# Patient Record
Sex: Female | Born: 1995 | Race: White | Hispanic: No | Marital: Married | Smoking: Never smoker
Health system: Southern US, Community
[De-identification: ages and names within clinical notes are randomized; demographics above are authoritative.]

## PROBLEM LIST (undated history)

## (undated) DIAGNOSIS — B019 Varicella without complication: Secondary | ICD-10-CM

## (undated) HISTORY — DX: Varicella without complication: B01.9

---
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
## 2015-02-09 ENCOUNTER — Ambulatory Visit: Payer: Self-pay | Admitting: Primary Care

## 2015-02-09 ENCOUNTER — Telehealth: Payer: Self-pay | Admitting: Primary Care

## 2015-02-09 NOTE — Telephone Encounter (Signed)
Patient did not come in for their appointment today for new pt.  Please let me know if patient needs to be contacted immediately for follow up or no follow up needed. °

## 2015-02-09 NOTE — Telephone Encounter (Signed)
Yes, please reschedule. This is not an urgent matter. Thanks.

## 2015-02-10 NOTE — Telephone Encounter (Signed)
Called and notified patient of Lorraine Wallace's comments. Patient verbalized understanding.  

## 2015-02-10 NOTE — Telephone Encounter (Signed)
R/s for august.   Also, pt is asking that we waive the no show fee. He thought the appt was June 10, not June 1.  Please call pt and let her know the decision for the no show fee. She sends her apologies.

## 2015-02-10 NOTE — Telephone Encounter (Signed)
No show fee, please. Thanks

## 2015-04-29 ENCOUNTER — Ambulatory Visit (INDEPENDENT_AMBULATORY_CARE_PROVIDER_SITE_OTHER): Payer: BLUE CROSS/BLUE SHIELD | Admitting: Primary Care

## 2015-04-29 ENCOUNTER — Encounter: Payer: Self-pay | Admitting: Primary Care

## 2015-04-29 VITALS — BP 116/70 | HR 106 | Temp 98.1°F | Ht 67.25 in | Wt 118.0 lb

## 2015-04-29 DIAGNOSIS — Z7689 Persons encountering health services in other specified circumstances: Secondary | ICD-10-CM

## 2015-04-29 DIAGNOSIS — Z7189 Other specified counseling: Secondary | ICD-10-CM | POA: Diagnosis not present

## 2015-04-29 NOTE — Progress Notes (Signed)
Pre visit review using our clinic review tool, if applicable. No additional management support is needed unless otherwise documented below in the visit note. 

## 2015-04-29 NOTE — Patient Instructions (Signed)
Please schedule a physical with me in the next 3 months. You will also schedule a lab only appointment one week prior. We will discuss your lab results during your physical.  It was a pleasure to meet you today! Please don't hesitate to call me with any questions. Welcome to [facility]!   

## 2015-04-29 NOTE — Progress Notes (Signed)
   Subjective:    Patient ID: Lorraine Wallace, female    DOB: 1996-03-27, 19 y.o.   MRN: [id number]  HPI  Lorraine Wallace is a 19 year old female who presents today to establish care.  She has no complaints today.  1) Oral contraceptives: Follows with GYN. Maintained on norethindrone-ethinyl estradiol 1-20 mg-mcg. Regular periods. No pain. Pap completed in June 2016.  Review of Systems  Constitutional: Negative for unexpected weight change.  HENT: Negative for rhinorrhea.   Respiratory: Negative for cough and shortness of breath.   Cardiovascular: Negative for chest pain.  Gastrointestinal: Negative for diarrhea and constipation.  Genitourinary: Negative for difficulty urinating.       Periods regular. Follows with GYN.  Musculoskeletal: Negative for myalgias and arthralgias.  Skin: Negative for rash.  Neurological: Negative for dizziness, numbness and headaches.  Psychiatric/Behavioral:       Denies concerns for anxiety or depression       Past Medical History  Diagnosis Date  . Chicken pox     Social History   Social History  . Marital Status: Single    Spouse Name: N/A  . Number of Children: N/A  . Years of Education: N/A   Occupational History  . Not on file.   Social History Main Topics  . Smoking status: Never Smoker   . Smokeless tobacco: Not on file  . Alcohol Use: 0.0 oz/week    0 Standard drinks or equivalent per week     Comment: social  . Drug Use: Not on file  . Sexual Activity: Not on file   Other Topics Concern  . Not on file   Social History Narrative   Single.   Highest level of education Associates.   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   Enjoys Diplomatic Services operational officer, shopping, going out to [employer].     History reviewed. No pertinent past surgical history.  Family History  Problem Relation Age of Onset  . Lung cancer Maternal Grandmother   . Stroke Maternal Grandmother   . Asthma Mother     No Known Allergies  No current outpatient  prescriptions on file prior to visit.   No current facility-administered medications on file prior to visit.    BP 116/70 mmHg  Pulse 106  Temp(Src) 98.1 F (36.7 C) (Oral)  Ht 5' 7.25" (1.708 m)  Wt 118 lb (53.524 kg)  BMI 18.35 kg/m2  SpO2 98%  LMP 04/27/2015    Objective:   Physical Exam  Constitutional: She is oriented to person, place, and time. She appears well-nourished.  Cardiovascular: Normal rate and regular rhythm.   Pulmonary/Chest: Effort normal and breath sounds normal.  Neurological: She is alert and oriented to person, place, and time.  Skin: Skin is warm and dry.  Psychiatric: She has a normal mood and affect.          Assessment & Plan:  Establish care:  Has not seen PCP in over 3 years. Once managed by Dr. Patsy Lager. No complaints today. Also managed by GYN, Will do physical in next 3 months.

## 2015-11-07 ENCOUNTER — Telehealth: Payer: Self-pay | Admitting: Primary Care

## 2015-11-07 NOTE — Telephone Encounter (Signed)
Patient Name: Lorraine Wallace DOB: Jan 13, 1996 Initial Comment Caller states about 90 mins after eating chinese food began vomiting and having diarrhea; in fetal position for 6 hrs with it; now headache, leg pains; ok for a couple of hours; also began period this morning; last urinated w/i past 3 hrs; Nurse Assessment Nurse: Elijah Birk, RN, Stark Bray Date/Time (Eastern Time): 11/07/2015 1:00:36 PM Confirm and document reason for call. If symptomatic, describe symptoms. You must click the next button to save text entered. ---Caller states about 90 mins after eating chinese food, she began vomiting and having watery diarrhea. Thinks it could have been the food. Symptoms started around 9 pm last night. Got very little sleep. She was in fetal position for 6 hrs with it. Now headache, lower back pain & upper leg pains, possibly d/t being on hard bathroom floor. Is drinking water slowly. Has been ok for a couple of hours. Also began period this morning. Has the patient traveled out of the country within the last 30 days? ---No Does the patient have any new or worsening symptoms? ---Yes Will a triage be completed? ---Yes Related visit to physician within the last 2 weeks? ---No Does the PT have any chronic conditions? (i.e. diabetes, asthma, etc.) ---No Is the patient pregnant or possibly pregnant? (Ask all females between the ages of 57-55) ---No Is this a behavioral health or substance abuse call? ---No Guidelines Guideline Title Affirmed Question Affirmed Notes Vomiting [1] SEVERE vomiting (e.g., 6 or more times/day) AND [2] present > 8 hours Final Disposition User Go to ED Now (or PCP triage) Elijah Birk, RN, Lynda Comments Caller states she is not feeling up to getting in a car to go anywhere right now. Feels she is able to keep fluids down now & her fiance is bringing home Gatorade and saltine crackers. She just started her cycle today as well. Not sure if headache, back discomfort  & thigh pain is related to being on hard bathroom floor all night, or something else. Advised to stay away from Ibuprofen for now, to take Tylenol for headache is needed. Caller will contact office if symptoms worsen. Disagree/Comply: Comply

## 2017-03-22 ENCOUNTER — Ambulatory Visit: Payer: BLUE CROSS/BLUE SHIELD | Admitting: Primary Care

## 2017-03-29 ENCOUNTER — Ambulatory Visit: Payer: BLUE CROSS/BLUE SHIELD | Admitting: Internal Medicine

## 2017-03-29 ENCOUNTER — Ambulatory Visit: Payer: BLUE CROSS/BLUE SHIELD | Admitting: Primary Care

## 2017-05-14 ENCOUNTER — Encounter: Payer: Self-pay | Admitting: Primary Care

## 2017-05-14 ENCOUNTER — Ambulatory Visit (INDEPENDENT_AMBULATORY_CARE_PROVIDER_SITE_OTHER): Payer: BLUE CROSS/BLUE SHIELD | Admitting: Primary Care

## 2017-05-14 ENCOUNTER — Ambulatory Visit (INDEPENDENT_AMBULATORY_CARE_PROVIDER_SITE_OTHER)
Admission: RE | Admit: 2017-05-14 | Discharge: 2017-05-14 | Disposition: A | Discharge status: Home / Self Care | Payer: BLUE CROSS/BLUE SHIELD | Source: Ambulatory Visit | Attending: Primary Care | Admitting: Primary Care

## 2017-05-14 VITALS — BP 112/70 | HR 108 | Temp 98.7°F | Ht 67.25 in | Wt 117.8 lb

## 2017-05-14 DIAGNOSIS — M546 Pain in thoracic spine: Secondary | ICD-10-CM | POA: Diagnosis not present

## 2017-05-14 DIAGNOSIS — G8929 Other chronic pain: Secondary | ICD-10-CM

## 2017-05-14 DIAGNOSIS — M4185 Other forms of scoliosis, thoracolumbar region: Secondary | ICD-10-CM | POA: Diagnosis not present

## 2017-05-14 NOTE — Patient Instructions (Signed)
Complete xray(s) prior to leaving today. I will notify you of your results once received.  Try stretching daily to prevent pain.  It was a pleasure to see you today!   Back Exercises The following exercises strengthen the muscles that help to support the back. They also help to keep the lower back flexible. Doing these exercises can help to prevent back pain or lessen existing pain. If you have back pain or discomfort, try doing these exercises 2-3 times each day or as told by your health care provider. When the pain goes away, do them once each day, but increase the number of times that you repeat the steps for each exercise (do more repetitions). If you do not have back pain or discomfort, do these exercises once each day or as told by your health care provider. Exercises Single Knee to Chest  Repeat these steps 3-5 times for each leg: 1. Lie on your back on a firm bed or the floor with your legs extended. 2. Bring one knee to your chest. Your other leg should stay extended and in contact with the floor. 3. Hold your knee in place by grabbing your knee or thigh. 4. Pull on your knee until you feel a gentle stretch in your lower back. 5. Hold the stretch for 10-30 seconds. 6. Slowly release and straighten your leg.  Pelvic Tilt  Repeat these steps 5-10 times: 1. Lie on your back on a firm bed or the floor with your legs extended. 2. Bend your knees so they are pointing toward the ceiling and your feet are flat on the floor. 3. Tighten your lower abdominal muscles to press your lower back against the floor. This motion will tilt your pelvis so your tailbone points up toward the ceiling instead of pointing to your feet or the floor. 4. With gentle tension and even breathing, hold this position for 5-10 seconds.  Cat-Cow  Repeat these steps until your lower back becomes more flexible: 1. Get into a hands-and-knees position on a firm surface. Keep your hands under your shoulders, and  keep your knees under your hips. You may place padding under your knees for comfort. 2. Let your head hang down, and point your tailbone toward the floor so your lower back becomes rounded like the back of a cat. 3. Hold this position for 5 seconds. 4. Slowly lift your head and point your tailbone up toward the ceiling so your back forms a sagging arch like the back of a cow. 5. Hold this position for 5 seconds.  Press-Ups  Repeat these steps 5-10 times: 1. Lie on your abdomen (face-down) on the floor. 2. Place your palms near your head, about shoulder-width apart. 3. While you keep your back as relaxed as possible and keep your hips on the floor, slowly straighten your arms to raise the top half of your body and lift your shoulders. Do not use your back muscles to raise your upper torso. You may adjust the placement of your hands to make yourself more comfortable. 4. Hold this position for 5 seconds while you keep your back relaxed. 5. Slowly return to lying flat on the floor.  Bridges  Repeat these steps 10 times: 1. Lie on your back on a firm surface. 2. Bend your knees so they are pointing toward the ceiling and your feet are flat on the floor. 3. Tighten your buttocks muscles and lift your buttocks off of the floor until your waist is at almost the same  height as your knees. You should feel the muscles working in your buttocks and the back of your thighs. If you do not feel these muscles, slide your feet 1-2 inches farther away from your buttocks. 4. Hold this position for 3-5 seconds. 5. Slowly lower your hips to the starting position, and allow your buttocks muscles to relax completely.  If this exercise is too easy, try doing it with your arms crossed over your chest. Abdominal Crunches  Repeat these steps 5-10 times: 1. Lie on your back on a firm bed or the floor with your legs extended. 2. Bend your knees so they are pointing toward the ceiling and your feet are flat on the  floor. 3. Cross your arms over your chest. 4. Tip your chin slightly toward your chest without bending your neck. 5. Tighten your abdominal muscles and slowly raise your trunk (torso) high enough to lift your shoulder blades a tiny bit off of the floor. Avoid raising your torso higher than that, because it can put too much stress on your low back and it does not help to strengthen your abdominal muscles. 6. Slowly return to your starting position.  Back Lifts Repeat these steps 5-10 times: 1. Lie on your abdomen (face-down) with your arms at your sides, and rest your forehead on the floor. 2. Tighten the muscles in your legs and your buttocks. 3. Slowly lift your chest off of the floor while you keep your hips pressed to the floor. Keep the back of your head in line with the curve in your back. Your eyes should be looking at the floor. 4. Hold this position for 3-5 seconds. 5. Slowly return to your starting position.  Contact a health care provider if:  Your back pain or discomfort gets much worse when you do an exercise.  Your back pain or discomfort does not lessen within 2 hours after you exercise. If you have any of these problems, stop doing these exercises right away. Do not do them again unless your health care provider says that you can. Get help right away if:  You develop sudden, severe back pain. If this happens, stop doing the exercises right away. Do not do them again unless your health care provider says that you can. This information is not intended to replace advice given to you by your health care provider. Make sure you discuss any questions you have with your health care provider. Document Released: 10/04/2004 Document Revised: 01/04/2016 Document Reviewed: 10/21/2014 Elsevier Interactive Patient Education  2017 [employer].

## 2017-05-14 NOTE — Progress Notes (Signed)
   Subjective:    Patient ID: Lorraine Wallace, female    DOB: [birth year], 21 y.o.   MRN: [id number]  HPI  Ms. Lorraine Wallace is a 21 year old female who presents today with a chief complaint of back pain. Her pain is located to the mid thoracic spine that she describes as a "pinching" sensation. This has been intermittent for the past 6 months. Her pain is only present when sitting or turning a specific way. Pain occurs infrequently as she's learned to avoid the position that causes the pain. She denies radiation of pain, injury/trauma, numbness/tingling. She's not had to take medication for her pain as her pain lasts for a very short period of time.   Review of Systems  Musculoskeletal: Positive for back pain.  Skin: Negative for color change.  Neurological: Negative for weakness and numbness.       Past Medical History:  Diagnosis Date  . Chicken pox      Social History   Social History  . Marital status: Single    Spouse name: N/A  . Number of children: N/A  . Years of education: N/A   Occupational History  . Not on file.   Social History Main Topics  . Smoking status: Never Smoker  . Smokeless tobacco: Never Used  . Alcohol use 0.0 oz/week     Comment: social  . Drug use: Unknown  . Sexual activity: Not on file   Other Topics Concern  . Not on file   Social History Narrative   Single.   Highest level of education Associates.   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   Enjoys Diplomatic Services operational officerphotography, shopping, going out to [employer].     No past surgical history on file.  Family History  Problem Relation Age of Onset  . Lung cancer Maternal Grandmother   . Stroke Maternal Grandmother   . Asthma Mother     No Known Allergies  Current Outpatient Prescriptions on File Prior to Visit  Medication Sig Dispense Refill  . norethindrone-ethinyl estradiol (JUNEL FE,GILDESS FE,LOESTRIN FE) 1-20 MG-MCG tablet Take 1 tablet by mouth daily.      No current facility-administered  medications on file prior to visit.     BP 112/70   Pulse (!) 108   Temp 98.7 F (37.1 C) (Oral)   Ht 5' 7.25" (1.708 m)   Wt 117 lb 12.8 oz (53.4 kg)   SpO2 99%   BMI 18.31 kg/m    Objective:   Physical Exam  Constitutional: She appears well-nourished.  Neck: Neck supple.  Cardiovascular: Normal rate and regular rhythm.   Pulmonary/Chest: Effort normal and breath sounds normal.  Musculoskeletal:       Thoracic back: She exhibits normal range of motion, no tenderness and no bony tenderness.       Back:  No pain today.  Skin: Skin is warm and dry.          Assessment & Plan:  Back Pain:  Intermittent for the past 6 months. Exam today unremarkable. Sounds to be MSK with subsequent nerve involvement.  Check plain films today given duration of symptoms. Recommended stretching/exercise regularly. Will await results.  Morrie Sheldonlark,Kyani Simkin Kendal, NP

## 2017-07-09 ENCOUNTER — Ambulatory Visit: Payer: Self-pay | Admitting: *Deleted

## 2017-07-09 NOTE — Telephone Encounter (Signed)
  Reason for Disposition . Unexplained nausea  Answer Assessment - Initial Assessment Questions 1. NAUSEA SEVERITY: "How bad is the nausea?" (e.g., mild, moderate, severe; dehydration, weight loss)   - MILD: loss of appetite without change in eating habits   - MODERATE: decreased oral intake without significant weight loss, dehydration, or malnutrition   - SEVERE: inadequate caloric or fluid intake, significant weight loss, symptoms of dehydration     mild 2. ONSET: "When did the nausea begin?"      1.5  days 3. VOMITING: "Any vomiting?" If so, ask: "How many times today?"       Had   Earlier   None   Since  1.5   Small  Amount  4. RECURRENT SYMPTOM: "Have you had nausea before?" If so, ask: "When was the last time?" "What happened that time?"    none 5. CAUSE: "What do you think is causing the nausea?"     Possibly  Something  She  Ate  Pizza   2  Days   Ago    6. PREGNANCY: "Is there any chance you are pregnant?" (e.g., unprotected intercourse, missed birth control pill, broken condom)      None    lmp  Last  Week  Protocols used: NAUSEA-A-AH  Pt   Advised    Clear  Liquids     Today  Advance  To  Brat   Diet   As  Tolerated    Advised  If   Symptoms  Not better  In  sev  Days   Or  If  Has  Any  Vomiting    Severe  Diarrhea   Abdominal  Pain    Let  [place]  Know

## 2017-07-10 ENCOUNTER — Ambulatory Visit: Payer: Self-pay | Admitting: *Deleted

## 2017-07-10 NOTE — Telephone Encounter (Signed)
At this point she can eat and drink just small amounts of crackers, soup, and fluid, still feels nauseated afterwards but reports it has slowly improved.  Reason for Disposition . Unexplained nausea  Answer Assessment - Initial Assessment Questions 1. NAUSEA SEVERITY: "How bad is the nausea?" (e.g., mild, moderate, severe; dehydration, weight loss)mild   - MILD: loss of appetite without change in eating habits   - MODERATE: decreased oral intake without significant weight loss, dehydration, or malnutrition   - SEVERE: inadequate caloric or fluid intake, significant weight loss, symptoms of dehydration      2. ONSET: "When did the nausea begin?"     Sunday night 3. VOMITING: "Any vomiting?" If so, ask: "How many times today?"     Early Monday morning none since them 4. RECURRENT SYMPTOM: "Have you had nausea before?" If so, ask: "When was the last time?" "What happened that time?"     no 5. CAUSE: "What do you think is causing the nausea?"     Possible food poisoning 6. PREGNANCY: "Is there any chance you are pregnant?" (e.g., unprotected intercourse, missed birth control pill, broken condom)     no  Protocols used: NAUSEA-A-AH

## 2017-08-14 ENCOUNTER — Ambulatory Visit (INDEPENDENT_AMBULATORY_CARE_PROVIDER_SITE_OTHER): Payer: BLUE CROSS/BLUE SHIELD | Admitting: Primary Care

## 2017-08-14 ENCOUNTER — Encounter: Payer: Self-pay | Admitting: Primary Care

## 2017-08-14 VITALS — BP 100/60 | HR 102 | Temp 98.3°F | Ht 67.25 in | Wt 119.0 lb

## 2017-08-14 DIAGNOSIS — J069 Acute upper respiratory infection, unspecified: Secondary | ICD-10-CM | POA: Diagnosis not present

## 2017-08-14 NOTE — Progress Notes (Signed)
Subjective:    Patient ID: Lorraine Wallace, female    DOB: Mar 11, 1996, 21 y.o.   MRN: [id number]  HPI  Ms. Lorraine Wallace is a 21 year old female who presents today with a chief complaint of cough. She also reports sore throat, swollen glands. Her symptoms began two days ago with a scratchy throat. She's feeling better today than yesterday, just the cough. She denies fevers, sinus pressure, nausea. She's been taking Tylenol, Vitamin D, Zinc with improvement.   Review of Systems  Constitutional: Negative for fatigue and fever.  HENT: Positive for congestion and sore throat. Negative for ear pain and sinus pain.   Respiratory: Positive for cough.   Cardiovascular: Negative for chest pain.       Past Medical History:  Diagnosis Date  . Chicken pox      Social History   Socioeconomic History  . Marital status: Single    Spouse name: Not on file  . Number of children: Not on file  . Years of education: Not on file  . Highest education level: Not on file  Social Needs  . Financial resource strain: Not on file  . Food insecurity - worry: Not on file  . Food insecurity - inability: Not on file  . Transportation needs - medical: Not on file  . Transportation needs - non-medical: Not on file  Occupational History  . Not on file  Tobacco Use  . Smoking status: Never Smoker  . Smokeless tobacco: Never Used  Substance and Sexual Activity  . Alcohol use: Yes    Alcohol/week: 0.0 oz    Comment: social  . Drug use: Not on file  . Sexual activity: Not on file  Other Topics Concern  . Not on file  Social History Narrative   Single.   Highest level of education Associates.   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   Enjoys Diplomatic Services operational officerphotography, shopping, going out to [employer].     No past surgical history on file.  Family History  Problem Relation Age of Onset  . Lung cancer Maternal Grandmother   . Stroke Maternal Grandmother   . Asthma Mother     No Known Allergies  Current Outpatient  Medications on File Prior to Visit  Medication Sig Dispense Refill  . norethindrone-ethinyl estradiol (JUNEL FE,GILDESS FE,LOESTRIN FE) 1-20 MG-MCG tablet Take 1 tablet by mouth daily.      No current facility-administered medications on file prior to visit.     BP 100/60   Pulse (!) 102   Temp 98.3 F (36.8 C) (Oral)   Ht 5' 7.25" (1.708 m)   Wt 119 lb (54 kg)   SpO2 99%   BMI 18.50 kg/m    Objective:   Physical Exam  Constitutional: She appears well-nourished.  HENT:  Right Ear: Tympanic membrane and ear canal normal.  Left Ear: Tympanic membrane and ear canal normal.  Nose: Right sinus exhibits no maxillary sinus tenderness and no frontal sinus tenderness. Left sinus exhibits no maxillary sinus tenderness and no frontal sinus tenderness.  Mouth/Throat: Oropharynx is clear and moist.  Eyes: Conjunctivae are normal.  Neck: Neck supple.  Cardiovascular: Normal rate and regular rhythm.  Pulmonary/Chest: Effort normal and breath sounds normal. She has no wheezes. She has no rales.  Lymphadenopathy:    She has no cervical adenopathy.  Skin: Skin is warm and dry.          Assessment & Plan:  URI:  Cough, mild congestion x 2 days.  Overall feeling better. Exam today overall unremarkable, no evidence of bacterial involvement. Will continue with conservative measures given that these have been effective. Continue tylenol, OTC cough suppressant, fluids, rest. Return precautions provided.  Morrie Sheldonlark,Lorraine Lavigne Kendal, NP

## 2017-08-14 NOTE — Patient Instructions (Signed)
Your symptoms are representative of a viral illness which will resolve on its own over time. Our goal is to treat your symptoms in order to aid your body in the healing process and to make you more comfortable.   Continue tylenol and cough suppressant as needed.  Ensure you are staying hydrated with water.   Please notify me if you develop persistent fevers of 101, start coughing up green mucous, notice increased fatigue or weakness, or feel worse after 1 week of onset of symptoms.   It was a pleasure to see you!   Upper Respiratory Infection, Adult Most upper respiratory infections (URIs) are caused by a virus. A URI affects the nose, throat, and upper air passages. The most common type of URI is often called "the common cold." Follow these instructions at home:  Take medicines only as told by your doctor.  Gargle warm saltwater or take cough drops to comfort your throat as told by your doctor.  Use a warm mist humidifier or inhale steam from a shower to increase air moisture. This may make it easier to breathe.  Drink enough fluid to keep your pee (urine) clear or pale yellow.  Eat soups and other clear broths.  Have a healthy diet.  Rest as needed.  Go back to work when your fever is gone or your doctor says it is okay. ? You may need to stay home longer to avoid giving your URI to others. ? You can also wear a face mask and wash your hands often to prevent spread of the virus.  Use your inhaler more if you have asthma.  Do not use any tobacco products, including cigarettes, chewing tobacco, or electronic cigarettes. If you need help quitting, ask your doctor. Contact a doctor if:  You are getting worse, not better.  Your symptoms are not helped by medicine.  You have chills.  You are getting more short of breath.  You have brown or red mucus.  You have yellow or brown discharge from your nose.  You have pain in your face, especially when you bend forward.  You  have a fever.  You have puffy (swollen) neck glands.  You have pain while swallowing.  You have white areas in the back of your throat. Get help right away if:  You have very bad or constant: ? Headache. ? Ear pain. ? Pain in your forehead, behind your eyes, and over your cheekbones (sinus pain). ? Chest pain.  You have long-lasting (chronic) lung disease and any of the following: ? Wheezing. ? Long-lasting cough. ? Coughing up blood. ? A change in your usual mucus.  You have a stiff neck.  You have changes in your: ? Vision. ? Hearing. ? Thinking. ? Mood. This information is not intended to replace advice given to you by your health care provider. Make sure you discuss any questions you have with your health care provider. Document Released: 02/13/2008 Document Revised: 04/29/2016 Document Reviewed: 12/02/2013 Elsevier Interactive Patient Education  2018 [employer]

## 2017-08-16 ENCOUNTER — Ambulatory Visit: Payer: BLUE CROSS/BLUE SHIELD | Admitting: Primary Care

## 2017-08-19 ENCOUNTER — Encounter: Payer: Self-pay | Admitting: Primary Care

## 2017-08-19 ENCOUNTER — Telehealth: Payer: BLUE CROSS/BLUE SHIELD | Admitting: Family

## 2017-08-19 ENCOUNTER — Ambulatory Visit: Payer: Self-pay | Admitting: *Deleted

## 2017-08-19 DIAGNOSIS — J028 Acute pharyngitis due to other specified organisms: Secondary | ICD-10-CM

## 2017-08-19 DIAGNOSIS — B9689 Other specified bacterial agents as the cause of diseases classified elsewhere: Secondary | ICD-10-CM

## 2017-08-19 MED ORDER — BENZONATATE 100 MG PO CAPS: 100.0000 mg | ORAL_CAPSULE | Freq: Three times a day (TID) | ORAL | 0 refills | Status: DC | PRN

## 2017-08-19 MED ORDER — AZITHROMYCIN 250 MG PO TABS: ORAL_TABLET | ORAL | 0 refills | Status: DC

## 2017-08-19 NOTE — Progress Notes (Signed)
Thank you for the details you included in the comment boxes. Those details are very helpful in determining the best course of treatment for you and help us to provide the best care. The template will say cough but it is for sinus AND cough.  We are sorry that you are not feeling well.  Here is how we plan to help!  Based on your presentation I believe you most likely have A cough due to bacteria.  When patients have a fever and a productive cough with a change in color or increased sputum production, we are concerned about bacterial bronchitis.  If left untreated it can progress to pneumonia.  If your symptoms do not improve with your treatment plan it is important that you contact your provider.   I have prescribed Azithromyin 250 mg: two tablets now and then one tablet daily for 4 additonal days    In addition you may use A non-prescription cough medication called Mucinex DM: take 2 tablets every 12 hours. and A prescription cough medication called Tessalon Perles 100mg . You may take 1-2 capsules every 8 hours as needed for your cough.    From your responses in the eVisit questionnaire you describe inflammation in the upper respiratory tract which is causing a significant cough.  This is commonly called Bronchitis and has four common causes:    Allergies  Viral Infections  Acid Reflux  Bacterial Infection Allergies, viruses and acid reflux are treated by controlling symptoms or eliminating the cause. An example might be a cough caused by taking certain blood pressure medications. You stop the cough by changing the medication. Another example might be a cough caused by acid reflux. Controlling the reflux helps control the cough.  USE OF BRONCHODILATOR ("RESCUE") INHALERS: There is a risk from using your bronchodilator too frequently.  The risk is that over-reliance on a medication which only relaxes the muscles surrounding the breathing tubes can reduce the effectiveness of medications  prescribed to reduce swelling and congestion of the tubes themselves.  Although you feel brief relief from the bronchodilator inhaler, your asthma may actually be worsening with the tubes becoming more swollen and filled with mucus.  This can delay other crucial treatments, such as oral steroid medications. If you need to use a bronchodilator inhaler daily, several times per day, you should discuss this with your provider.  There are probably better treatments that could be used to keep your asthma under control.     HOME CARE . Only take medications as instructed by your medical team. . Complete the entire course of an antibiotic. . Drink plenty of fluids and get plenty of rest. . Avoid close contacts especially the very young and the elderly . Cover your mouth if you cough or cough into your sleeve. . Always remember to wash your hands . A steam or ultrasonic humidifier can help congestion.   GET HELP RIGHT AWAY IF: . You develop worsening fever. . You become short of breath . You cough up blood. . Your symptoms persist after you have completed your treatment plan MAKE SURE YOU   Understand these instructions.  Will watch your condition.  Will get help right away if you are not doing well or get worse.  Your e-visit answers were reviewed by a board certified advanced clinical practitioner to complete your personal care plan.  Depending on the condition, your plan could have included both over the counter or prescription medications. If there is a problem please reply  once  you have received a response from your provider. Your safety is important to us.  If you have drug allergies check your prescription carefully.    You can use MyChart to ask questions about today's visit, request a non-urgent call back, or ask for a work or school excuse for 24 hours related to this e-Visit. If it has been greater than 24 hours you will need to follow up with your provider, or enter a new e-Visit to  address those concerns. You will get an e-mail in the next two days asking about your experience.  I hope that your e-visit has been valuable and will speed your recovery. Thank you for using e-visits.

## 2017-08-19 NOTE — Telephone Encounter (Signed)
  Reason for Disposition . [1] Sinus pain of forehead AND [2] yellow or green nasal discharge  Answer Assessment - Initial Assessment Questions 1. LOCATION: "Where does it hurt?"      Headache and sinus pain, at top of eyes and under eyes 2. ONSET: "When did the headache start?" (Minutes, hours or days)    Began early this morning   3. PATTERN: "Does the pain come and go, or has it been constant since it started?"     Took tylenol at 0500 today and now pain is back 4. SEVERITY: "How bad is the pain?" and "What does it keep you from doing?"  (e.g., Scale 1-10; mild, moderate, or severe)   - MILD (1-3): doesn't interfere with normal activities    - MODERATE (4-7): interferes with normal activities or awakens from sleep    - SEVERE (8-10): excruciating pain, unable to do any normal activities       7 5. RECURRENT SYMPTOM: "Have you ever had headaches before?" If so, ask: "When was the last time?" and "What happened that time?"     no 6. CAUSE: "What do you think is causing the headache?"    sinus 7. MIGRAINE: "Have you been diagnosed with migraine headaches?" If so, ask: "Is this headache similar?"     no 8. HEAD INJURY: "Has there been any recent injury to the head?"      no 9. OTHER SYMPTOMS: "Do you have any other symptoms?" (fever, stiff neck, eye pain, sore throat, cold symptoms)     Diagnosed with recent URI 10. PREGNANCY: "Is there any chance you are pregnant?" "When was your last menstrual period?"      no  Protocols used: HEADACHE-A-AH

## 2017-08-20 ENCOUNTER — Encounter: Payer: Self-pay | Admitting: Primary Care

## 2017-08-22 ENCOUNTER — Other Ambulatory Visit: Payer: Self-pay | Admitting: Primary Care

## 2017-08-22 DIAGNOSIS — J019 Acute sinusitis, unspecified: Secondary | ICD-10-CM | POA: Diagnosis not present

## 2017-08-22 DIAGNOSIS — B9689 Other specified bacterial agents as the cause of diseases classified elsewhere: Secondary | ICD-10-CM | POA: Diagnosis not present

## 2017-08-22 NOTE — Telephone Encounter (Addendum)
Relation to pt: self Call back number:[phone] Pharmacy:  [facility] [address] - [place], [place] - [address] [phone] (Phone) [phone] (Fax)     Reason for call:  Patient scheduled with Dr. Eustaquio BoydenJavier Gutierrez for 08/23/17 at 2:45pm patient states unsure if she can keep appointment due to her work schedule.   Patient did inquire if covering physician can prescribe amoxicillin patient states this medication worked before patient did advise please respond regarding amoxacillin via my chart, please advise

## 2017-08-22 NOTE — Telephone Encounter (Signed)
Copied from CRM [id number]. Topic: Inquiry >> Aug 22, 2017 10:06 AM Stephannie LiSimmons, Anjel Pardo L, NT wrote: Reason for CRM: Patient was seen 12/5/ and also had a e visit , on Monday and symptoms have not improved greatly still has headache,sinus issues would like another antibiotic called in please advise [phone] please leave detailed message on voicemail ,

## 2017-08-22 NOTE — Telephone Encounter (Signed)
I spoke with pt and she has done evisit already and due to her schedule pt cannot come in for appt; offered several different appts but not convenient for pts schedule. Pt said she will go to an UC.

## 2017-08-22 NOTE — Telephone Encounter (Signed)
She would need to make an appt to be seen for reevaluation.

## 2017-08-22 NOTE — Telephone Encounter (Signed)
Please see 08/20/17 pt email.Please advise.

## 2017-08-23 ENCOUNTER — Ambulatory Visit: Payer: BLUE CROSS/BLUE SHIELD | Admitting: Family Medicine

## 2017-09-29 ENCOUNTER — Encounter: Payer: Self-pay | Admitting: Primary Care

## 2017-09-30 ENCOUNTER — Encounter: Payer: Self-pay | Admitting: Primary Care

## 2018-03-07 ENCOUNTER — Encounter: Payer: Self-pay | Admitting: Primary Care

## 2018-05-09 DIAGNOSIS — Z1389 Encounter for screening for other disorder: Secondary | ICD-10-CM | POA: Diagnosis not present

## 2018-05-09 DIAGNOSIS — Z01419 Encounter for gynecological examination (general) (routine) without abnormal findings: Secondary | ICD-10-CM | POA: Diagnosis not present

## 2018-05-09 DIAGNOSIS — Z124 Encounter for screening for malignant neoplasm of cervix: Secondary | ICD-10-CM | POA: Diagnosis not present

## 2018-05-09 DIAGNOSIS — Z681 Body mass index (BMI) 19 or less, adult: Secondary | ICD-10-CM | POA: Diagnosis not present

## 2018-05-09 DIAGNOSIS — Z113 Encounter for screening for infections with a predominantly sexual mode of transmission: Secondary | ICD-10-CM | POA: Diagnosis not present

## 2018-08-29 ENCOUNTER — Ambulatory Visit (INDEPENDENT_AMBULATORY_CARE_PROVIDER_SITE_OTHER): Payer: BLUE CROSS/BLUE SHIELD | Admitting: Primary Care

## 2018-08-29 ENCOUNTER — Encounter: Payer: Self-pay | Admitting: Primary Care

## 2018-08-29 VITALS — BP 100/78 | HR 107 | Temp 98.3°F | Ht 67.25 in | Wt 118.0 lb

## 2018-08-29 DIAGNOSIS — J029 Acute pharyngitis, unspecified: Secondary | ICD-10-CM

## 2018-08-29 NOTE — Progress Notes (Signed)
Subjective:    Patient ID: Lorraine Wallace, female    DOB: September 12, 1995, 22 y.o.   MRN: [id number]  HPI  Ms. Lorraine Wallace is a 22 year old female who presents today with a chief complaint of sore throat sore.   She also reports swelling to the throat, mild lightheadedness, headaches. Her symptoms began 2 days ago. She denies fevers, cough, chills. She's been exposed to illness from two co-workers. She's taken Tylenol today with improvement in headache.    Review of Systems  Constitutional: Positive for fatigue. Negative for chills and fever.  HENT: Positive for postnasal drip. Negative for congestion and sinus pressure.   Respiratory: Negative for cough.   Neurological: Positive for light-headedness and headaches.       Past Medical History:  Diagnosis Date  . Chicken pox      Social History   Socioeconomic History  . Marital status: Single    Spouse name: Not on file  . Number of children: Not on file  . Years of education: Not on file  . Highest education level: Not on file  Occupational History  . Not on file  Social Needs  . Financial resource strain: Not on file  . Food insecurity:    Worry: Not on file    Inability: Not on file  . Transportation needs:    Medical: Not on file    Non-medical: Not on file  Tobacco Use  . Smoking status: Never Smoker  . Smokeless tobacco: Never Used  Substance and Sexual Activity  . Alcohol use: Yes    Alcohol/week: 0.0 standard drinks    Comment: social  . Drug use: Not on file  . Sexual activity: Not on file  Lifestyle  . Physical activity:    Days per week: Not on file    Minutes per session: Not on file  . Stress: Not on file  Relationships  . Social connections:    Talks on phone: Not on file    Gets together: Not on file    Attends religious service: Not on file    Active member of club or organization: Not on file    Attends meetings of clubs or organizations: Not on file    Relationship status: Not on file  .  Intimate partner violence:    Fear of current or ex partner: Not on file    Emotionally abused: Not on file    Physically abused: Not on file    Forced sexual activity: Not on file  Other Topics Concern  . Not on file  Social History Narrative   Single.   Highest level of education Associates.   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   Enjoys Diplomatic Services operational officerphotography, shopping, going out to [employer].     No past surgical history on file.  Family History  Problem Relation Age of Onset  . Lung cancer Maternal Grandmother   . Stroke Maternal Grandmother   . Asthma Mother     No Known Allergies  Current Outpatient Medications on File Prior to Visit  Medication Sig Dispense Refill  . norethindrone-ethinyl estradiol (JUNEL FE,GILDESS FE,LOESTRIN FE) 1-20 MG-MCG tablet Take 1 tablet by mouth daily.      No current facility-administered medications on file prior to visit.     BP 100/78 (BP Location: Right Arm, Patient Position: Sitting, Cuff Size: Normal)   Pulse (!) 107   Temp 98.3 F (36.8 C) (Oral)   Ht 5' 7.25" (1.708 m)   Wt  118 lb (53.5 kg)   SpO2 97%   BMI 18.34 kg/m    Objective:   Physical Exam  Constitutional: She appears well-nourished. She does not appear ill.  HENT:  Right Ear: Tympanic membrane and ear canal normal.  Left Ear: Tympanic membrane and ear canal normal.  Nose: Mucosal edema present. Right sinus exhibits no maxillary sinus tenderness and no frontal sinus tenderness. Left sinus exhibits no maxillary sinus tenderness and no frontal sinus tenderness.  Mouth/Throat: Posterior oropharyngeal erythema present. No oropharyngeal exudate or posterior oropharyngeal edema.  Neck: Neck supple.  Cardiovascular: Normal rate and regular rhythm.  Respiratory: Effort normal and breath sounds normal. She has no wheezes.  Skin: Skin is warm and dry.           Assessment & Plan:  Viral Pharyngitis:  Sore throat, fatigue, PND x 2 days. Exam today with mild erythema to  posterior throat. Tachycardia on exam. Rapid Strep: Negative Suspect viral involvement and will treat with conservative measures. Discussed use of Tylenol/Ibuprofen for pain and inflammation, Chloraseptic spray, warm liquids. Return precautions provided.  Doreene NestKatherine K Clark, NP

## 2018-08-29 NOTE — Patient Instructions (Signed)
Your symptoms are representative of a viral illness which will resolve on its own over time. Our goal is to treat your symptoms in order to aid your body in the healing process and to make you more comfortable.   You can try Ibuprofen 600 mg every 8 hours as needed for pain and inflammation.  Also try warm salt gargles, chloraseptic spray.  Please notify me if you develop persistent fevers of 101, notice increased fatigue or weakness, and/or feel worse after 1 week of onset of symptoms.   Increase consumption of water intake and rest.  It was a pleasure to see you today!

## 2019-02-27 ENCOUNTER — Telehealth: Payer: Self-pay

## 2019-02-27 NOTE — Telephone Encounter (Signed)
Pt scheduled appt in office 03/04/19 at 3:20 (first appt that pt could schedule.

## 2019-02-27 NOTE — Telephone Encounter (Signed)
[name] - Client Nonclinical Telephone Record [facility]urse Client [place] Night - Client Client Site [place] Physician Alma Friendly - NP Contact Type Call Who Is Calling Patient / Member / Family / Caregiver Caller Name [place] Phone Number [phone] Patient Name Lorraine Wallace Patient DOB 1996/06/20 Call Type Message Only Information Provided Reason for Call Request to Schedule Office Appointment Initial Comment Caller is requesting to make an appointment. Additional Comment Provided Office Hours. Call Closed By: Erich Montane Transaction Date/Time: 02/26/2019 8:46:51 PM (ET)

## 2019-03-04 ENCOUNTER — Ambulatory Visit (INDEPENDENT_AMBULATORY_CARE_PROVIDER_SITE_OTHER): Payer: BC Managed Care – PPO | Admitting: Primary Care

## 2019-03-04 ENCOUNTER — Other Ambulatory Visit: Payer: Self-pay

## 2019-03-04 ENCOUNTER — Encounter: Payer: Self-pay | Admitting: Primary Care

## 2019-03-04 VITALS — BP 106/70 | HR 115 | Temp 98.5°F | Ht 67.25 in | Wt 118.2 lb

## 2019-03-04 DIAGNOSIS — R350 Frequency of micturition: Secondary | ICD-10-CM

## 2019-03-04 HISTORY — DX: Frequency of micturition: R35.0

## 2019-03-04 LAB — POC URINALSYSI DIPSTICK (AUTOMATED)
Bilirubin, UA: NEGATIVE
Glucose, UA: NEGATIVE
Ketones, UA: NEGATIVE
Leukocytes, UA: NEGATIVE
Nitrite, UA: NEGATIVE
Protein, UA: NEGATIVE
Spec Grav, UA: 1.02 (ref 1.010–1.025)
Urobilinogen, UA: 0.2 E.U./dL
pH, UA: 6 (ref 5.0–8.0)

## 2019-03-04 NOTE — Progress Notes (Signed)
Subjective:    Patient ID: Lorraine Wallace, female    DOB: [birth year], 23 y.o.   MRN: [id number]  HPI  Lorraine Wallace is a 23 year old female who presents today with a chief complaint of urinary frequency.  She wakes up once nightly to urinate, urinates 10-12 times throughout the day. She doesn't typically feel as though she empties her bladder completely, will sometimes have to wait several minutes prior to urinating. She denies dysuria, hematuria, pelvic pressure, unexplained weight loss. Symptoms began 2-3 years ago and have progressed since.   She drinks one bottle of Gatorade daily, 12 ounces of coffee several times weekly, and about 12-16 ounces of water daily. She mostly doesn't feel thirsty and thinks she doesn't drink much.   She takes her OCP's continuously so she has periods every three months. LMP was about 3 months ago. She does follow with GYN and hasn't mentioned these symptoms.   Wt Readings from Last 3 Encounters:  03/04/19 118 lb 4 oz (53.6 kg)  08/29/18 118 lb (53.5 kg)  08/14/17 119 lb (54 kg)     Review of Systems  Constitutional: Negative for unexpected weight change.  Gastrointestinal: Negative for abdominal pain.  Genitourinary: Positive for frequency. Negative for dysuria, flank pain, hematuria and vaginal discharge.       Past Medical History:  Diagnosis Date  . Chicken pox      Social History   Socioeconomic History  . Marital status: Single    Spouse name: Not on file  . Number of children: Not on file  . Years of education: Not on file  . Highest education level: Not on file  Occupational History  . Not on file  Social Needs  . Financial resource strain: Not on file  . Food insecurity    Worry: Not on file    Inability: Not on file  . Transportation needs    Medical: Not on file    Non-medical: Not on file  Tobacco Use  . Smoking status: Never Smoker  . Smokeless tobacco: Never Used  Substance and Sexual Activity  . Alcohol use: Yes     Alcohol/week: 0.0 standard drinks    Comment: social  . Drug use: Not on file  . Sexual activity: Not on file  Lifestyle  . Physical activity    Days per week: Not on file    Minutes per session: Not on file  . Stress: Not on file  Relationships  . Social Herbalist on phone: Not on file    Gets together: Not on file    Attends religious service: Not on file    Active member of club or organization: Not on file    Attends meetings of clubs or organizations: Not on file    Relationship status: Not on file  . Intimate partner violence    Fear of current or ex partner: Not on file    Emotionally abused: Not on file    Physically abused: Not on file    Forced sexual activity: Not on file  Other Topics Concern  . Not on file  Social History Narrative   Single.   Highest level of education Associates.   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   Enjoys Scientist, water quality, shopping, going out to [employer]     No past surgical history on file.  Family History  Problem Relation Age of Onset  . Lung cancer Maternal Grandmother   . Stroke Maternal  Grandmother   . Asthma Mother     No Known Allergies  Current Outpatient Medications on File Prior to Visit  Medication Sig Dispense Refill  . norethindrone-ethinyl estradiol (JUNEL FE,GILDESS FE,LOESTRIN FE) 1-20 MG-MCG tablet Take 1 tablet by mouth daily.      No current facility-administered medications on file prior to visit.     BP 106/70   Pulse (!) 115   Temp 98.5 F (36.9 C) (Tympanic)   Ht 5' 7.25" (1.708 m)   Wt 118 lb 4 oz (53.6 kg)   SpO2 98%   BMI 18.38 kg/m    Objective:   Physical Exam  Constitutional: She appears well-nourished.  Neck: Neck supple.  Cardiovascular: Normal rate and regular rhythm.  Respiratory: Effort normal and breath sounds normal.  GI: Soft. Bowel sounds are normal. There is no abdominal tenderness.  Skin: Skin is warm and dry.           Assessment & Plan:

## 2019-03-04 NOTE — Patient Instructions (Signed)
Stop by the lab prior to leaving today. I will notify you of your results once received.   You will be contacted regarding your referral to Urology.  Please let us know if you have not been contacted within one week.   Ensure you are consuming 64 ounces of water daily. Limit caffeine, alcohol consumption, sugary drinks and food. These will cause frequency.  It was a pleasure to see you today!

## 2019-03-04 NOTE — Assessment & Plan Note (Signed)
Unclear etiology. UA today with trace blood, no prior UA in records to compare.  Check labs today including TSH, CBC, CMP. Referral placed to Urology for further evaluation.

## 2019-03-05 LAB — COMPREHENSIVE METABOLIC PANEL
ALT: 11 U/L (ref 0–35)
AST: 13 U/L (ref 0–37)
Albumin: 4.5 g/dL (ref 3.5–5.2)
Alkaline Phosphatase: 57 U/L (ref 39–117)
BUN: 11 mg/dL (ref 6–23)
CO2: 24 mEq/L (ref 19–32)
Calcium: 9.6 mg/dL (ref 8.4–10.5)
Chloride: 103 mEq/L (ref 96–112)
Creatinine, Ser: 0.91 mg/dL (ref 0.40–1.20)
GFR: 75.32 mL/min (ref >60.00)
Glucose, Bld: 83 mg/dL (ref 70–99)
Potassium: 4.6 mEq/L (ref 3.5–5.1)
Sodium: 135 mEq/L (ref 135–145)
Total Bilirubin: 0.6 mg/dL (ref 0.2–1.2)
Total Protein: 7.6 g/dL (ref 6.0–8.3)

## 2019-03-05 LAB — CBC
HCT: 44.5 % (ref 36.0–46.0)
Hemoglobin: 15.9 g/dL — ABNORMAL HIGH (ref 12.0–15.0)
MCHC: 35.7 g/dL (ref 30.0–36.0)
MCV: 93.4 fl (ref 78.0–100.0)
Platelets: 245 10*3/uL (ref 150.0–400.0)
RBC: 4.76 Mil/uL (ref 3.87–5.11)
RDW: 12.5 % (ref 11.5–15.5)
WBC: 8.1 10*3/uL (ref 4.0–10.5)

## 2019-03-05 LAB — TSH: TSH: 2.71 u[IU]/mL (ref 0.35–4.50)

## 2019-03-17 NOTE — Progress Notes (Signed)
Lorraine Baby T. Oasis Goehring, MD Primary Care and Sports Medicine [facility] [address] [place], [id number] Phone: [phone]  FAX: [phone]  Lorraine Wallace - 23 y.o. female  MRN [id number]  Date of Birth: 1996-02-26  Visit Date: 03/18/2019  PCP: Doreene Nestlark, Katherine K, NP  Referred by: Doreene Nestlark, Katherine K, NP  Chief Complaint  Patient presents with  . Knee Pain    Right   Subjective:   Lorraine Wallace is a 23 y.o. very pleasant female patient who presents with the following:  Patient of Graylon GunningKate Clark's who presents for knee pain evaluation:  Comes and goes, sometimes will be sittng there and other times it will be very faint.  Sometimes it will hurt and put pressure on it.  Feels like it is on the inside.  Popping frequently. No injuries.  Not working out much now.  Longer walk in the night.  Desk job all day.   She has really been exercising a lot less since the COVID-19 outbreak for the last few months.  She has a desk job all day.  She does have pain in the middle of her knee.  She does not describe any back pain or hip pain.  She is walking twice a day.  No prior significant injuries or surgery.  No fractures.  At this point she is only done some very basic strengthening without any kind of specific rehab protocols.  Past Medical History, Surgical History, Social History, Family History, Problem List, Medications, and Allergies have been reviewed and updated if relevant.  Patient Active Problem List   Diagnosis Date Noted  . Urinary frequency 03/04/2019    Past Medical History:  Diagnosis Date  . Chicken pox     History reviewed. No pertinent surgical history.  Social History   Socioeconomic History  . Marital status: Single    Spouse name: Not on file  . Number of children: Not on file  . Years of education: Not on file  . Highest education level: Not on file  Occupational History  . Not on file  Social Needs  .  Financial resource strain: Not on file  . Food insecurity    Worry: Not on file    Inability: Not on file  . Transportation needs    Medical: Not on file    Non-medical: Not on file  Tobacco Use  . Smoking status: Never Smoker  . Smokeless tobacco: Never Used  Substance and Sexual Activity  . Alcohol use: Yes    Alcohol/week: 0.0 standard drinks    Comment: social  . Drug use: Not on file  . Sexual activity: Not on file  Lifestyle  . Physical activity    Days per week: Not on file    Minutes per session: Not on file  . Stress: Not on file  Relationships  . Social Musicianconnections    Talks on phone: Not on file    Gets together: Not on file    Attends religious service: Not on file    Active member of club or organization: Not on file    Attends meetings of clubs or organizations: Not on file    Relationship status: Not on file  . Intimate partner violence    Fear of current or ex partner: Not on file    Emotionally abused: Not on file    Physically abused: Not on file    Forced sexual activity: Not on file  Other Topics Concern  . Not on file  Social History Narrative   Single.   Highest level of education Associates.   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   Enjoys Scientist, water quality, shopping, going out to [employer]     Family History  Problem Relation Age of Onset  . Lung cancer Maternal Grandmother   . Stroke Maternal Grandmother   . Asthma Mother     No Known Allergies  Medication list reviewed and updated in full in [facility] [place].  GEN: No fevers, chills. Nontoxic. Primarily MSK c/o today. MSK: Detailed in the HPI GI: tolerating PO intake without difficulty Neuro: No numbness, parasthesias, or tingling associated. Otherwise the pertinent positives of the ROS are noted above.   Objective:   BP (!) 84/60   Pulse 90   Temp 98.7 F (37.1 C) (Temporal)   Ht 5' 7.25" (1.708 m)   Wt 116 lb 12 oz (53 kg)   SpO2 98%   BMI 18.15 kg/m    GEN: WDWN, NAD,  Non-toxic, Alert & Oriented x 3 HEENT: Atraumatic, Normocephalic.  Ears and Nose: No external deformity. EXTR: No clubbing/cyanosis/edema NEURO: Normal gait.  PSYCH: Normally interactive. Conversant. Not depressed or anxious appearing.  Calm demeanor.   Knee:  R Gait: Normal heel toe pattern ROM: 0-135 Effusion: neg Echymosis or edema: none Patellar tendon NT Painful PLICA: neg Patellar grind: minimal / mild on the right Medial and lateral patellar facet loading: negative medial and lateral joint lines:NT Mcmurray's neg Flexion-pinch neg Varus and valgus stress: stable Lachman: neg Ant and Post drawer: neg Hip abduction, IR, ER: WNL Hip flexion str: 5/5 Hip abd: 4-/5 on the R and 4/5 on the L Quad: 5/5 VMO atrophy:No Hamstring concentric and eccentric: 5/5   Poor proprioception on 1 leg, very poor with eyes closed R >> L Inability to do single leg squat without falling over on the R side  Radiology: No results found.  Assessment and Plan:     ICD-10-CM   1. Patellofemoral syndrome of right knee  M22.2X1   2. Weakness of right hip  R29.898    >25 minutes spent in face to face time with patient, >50% spent in counselling or coordination of care  Initial instructions, then gave more advanced proprioception  Patient Instructions  [facility] PT, [facility] PT, Mike Craze  Basic Proprioception:  Every time you brush your teeth, stand on 1 foot, 30 sec - 1 minute each When you get better, do it with your eyes closed.  When you get even better, do with a pillow or pad underneath your foot.  Hip Rehab:  Hip Abductions: Lying on side, straight out to side. 3 sets, work up to being able to do #30 with each set.  At the beginning you may only be able to do a lot less, try to do #10.   Hamstring and hip flexibility Work on your pigeon pose or modified pigeon pose    Follow-up: 2 mo  No orders of the defined types were placed in this encounter.   No orders of the defined types were placed in this encounter.   Signed,  Maud Deed. Joline Encalada, MD   Outpatient Encounter Medications as of 03/18/2019  Medication Sig  . norethindrone-ethinyl estradiol (JUNEL FE,GILDESS FE,LOESTRIN FE) 1-20 MG-MCG tablet Take 1 tablet by mouth daily.    No facility-administered encounter medications on file as of 03/18/2019.

## 2019-03-18 ENCOUNTER — Other Ambulatory Visit: Payer: Self-pay

## 2019-03-18 ENCOUNTER — Ambulatory Visit: Payer: BC Managed Care – PPO | Admitting: Family Medicine

## 2019-03-18 ENCOUNTER — Encounter: Payer: Self-pay | Admitting: Family Medicine

## 2019-03-18 VITALS — BP 84/60 | HR 90 | Temp 98.7°F | Ht 67.25 in | Wt 116.8 lb

## 2019-03-18 DIAGNOSIS — R29898 Other symptoms and signs involving the musculoskeletal system: Secondary | ICD-10-CM | POA: Diagnosis not present

## 2019-03-18 DIAGNOSIS — M222X1 Patellofemoral disorders, right knee: Secondary | ICD-10-CM

## 2019-03-18 NOTE — Patient Instructions (Addendum)
[facility] PT, [facility], Mike Craze  Basic Proprioception:  Every time you brush your teeth, stand on 1 foot, 30 sec - 1 minute each When you get better, do it with your eyes closed.  When you get even better, do with a pillow or pad underneath your foot.  Hip Rehab:  Hip Abductions: Lying on side, straight out to side. 3 sets, work up to being able to do #30 with each set.  At the beginning you may only be able to do a lot less, try to do #10.   Hamstring and hip flexibility Work on your pigeon pose or modified pigeon pose

## 2019-03-19 ENCOUNTER — Encounter: Payer: Self-pay | Admitting: Family Medicine

## 2019-04-23 ENCOUNTER — Ambulatory Visit: Payer: Self-pay

## 2019-04-23 DIAGNOSIS — T781XXA Other adverse food reactions, not elsewhere classified, initial encounter: Secondary | ICD-10-CM

## 2019-04-23 NOTE — Telephone Encounter (Signed)
Patient called to schedule appointment.  I asked Dr.Copland and I let him know patient's symptoms. She told me she had diarrhea throwing up and severe stomach pain.  Dr.Copland said patient needed to go to the ER to be evaluated. I notified patient and she said she hadn't had diarrhea or throwing up since midnight and the stomach pain was better, but she had a bad headache and she felt dehydrated.I let patient know we couldn't see her in the office with her symptoms.  Patient said she'd call [facility] to see if they could give her fluids.

## 2019-04-23 NOTE — Telephone Encounter (Signed)
Pt. Called to report onset of abdominal pain and diarrhea at approx. 2:00 PM yesterday.  Vomiting started at about 8:00 PM last eve, and had about 4 episodes; last emesis was 12:00 MN.  Has had about 4-5 diarrhea stools. C/o "splitting headache" at this time.  Has taken sips of water only this morning.  C/o mild light-headed feeling.  C/o dry mouth.  Stated urine is darker than usual.  Denied any recent travel.  Questioned if she has had food poisoning from eating pasta with Alfredo sauce 2 nights ago, although reported her husband ate the same thing.  Care advice given per protocol.  Advised of signs of dehydration.  [facility]  Left message on FC line re: need to contact pt. For an appt.  Also will forward Triage note to the office with high priority.  Advised pt. To return call to office, if no one has contacted her back, by 1:30 PM today.  Verb. Understanding.  Agreed with plan.      Reason for Disposition . [1] MODERATE diarrhea (e.g., 4-6 times / day more than normal) AND [2] present > 48 hours (2 days)  Answer Assessment - Initial Assessment Questions 1. DIARRHEA SEVERITY: "How bad is the diarrhea?" "How many extra stools have you had in the past 24 hours than normal?"    - NO DIARRHEA (SCALE 0)   - MILD (SCALE 1-3): Few loose or mushy BMs; increase of 1-3 stools over normal daily number of stools; mild increase in ostomy output.   -  MODERATE (SCALE 4-7): Increase of 4-6 stools daily over normal; moderate increase in ostomy output. * SEVERE (SCALE 8-10; OR 'WORST POSSIBLE'): Increase of 7 or more stools daily over normal; moderate increase in ostomy output; incontinence.     4-5 stools since approx. 2:00 PM 8/12 2. ONSET: "When did the diarrhea begin?"      2:00 PM  3. BM CONSISTENCY: "How loose or watery is the diarrhea?"      Loose with small pieces of stool; no blood noted  4. VOMITING: "Are you also vomiting?" If so, ask: "How  many times in the past 24 hours?"      Vomited between 8:00-12:00 MN; approx. 4-6 times 5. ABDOMINAL PAIN: "Are you having any abdominal pain?" If yes: "What does it feel like?" (e.g., crampy, dull, intermittent, constant)      Had abdominal pain for about 12 hrs.; abdominal discomfort has eased 6. ABDOMINAL PAIN SEVERITY: If present, ask: "How bad is the pain?"  (e.g., Scale 1-10; mild, moderate, or severe)   - MILD (1-3): doesn't interfere with normal activities, abdomen soft and not tender to touch    - MODERATE (4-7): interferes with normal activities or awakens from sleep, tender to touch    - SEVERE (8-10): excruciating pain, doubled over, unable to do any normal activities      Generalized abdominal discomfort at 6-7/10; now improved 7. ORAL INTAKE: If vomiting, "Have you been able to drink liquids?" "How much fluids have you had in the past 24 hours?"    Only taking sips of water. 8. HYDRATION: "Any signs of dehydration?" (e.g., dry mouth [not just dry lips], too weak to stand, dizziness, new weight loss) "When did you last urinate?"     Light headed, dry mouth 9. EXPOSURE: "Have you traveled to a foreign country recently?" "Have you been exposed to anyone with diarrhea?" "Could you have eaten any food that  was spoiled?"     No recent travel; poss. Food poisoning with pasta and alfredo sauce 10. ANTIBIOTIC USE: "Are you taking antibiotics now or have you taken antibiotics in the past 2 months?"       n/a 11. OTHER SYMPTOMS: "Do you have any other symptoms?" (e.g., fever, blood in stool)      Feeling light-headed, c/o headache, c/o dry mouth,  12. PREGNANCY: "Is there any chance you are pregnant?" "When was your last menstrual cycle  last period was about 3 mos. Ago; on [facility]; does not think she is pregnant  Protocols used: DIARRHEA-A-AH

## 2019-04-23 NOTE — Telephone Encounter (Signed)
Noted.  See my chart message. 

## 2019-04-23 NOTE — Telephone Encounter (Signed)
I tried to reach pt by phone but unable to.Dpr does not allow [place] to speak with anyone else.

## 2019-04-30 NOTE — Addendum Note (Signed)
Addended by: Pleas Koch on: 04/30/2019 08:41 PM   Modules accepted: Orders

## 2019-05-15 DIAGNOSIS — Z681 Body mass index (BMI) 19 or less, adult: Secondary | ICD-10-CM | POA: Diagnosis not present

## 2019-05-15 DIAGNOSIS — Z01419 Encounter for gynecological examination (general) (routine) without abnormal findings: Secondary | ICD-10-CM | POA: Diagnosis not present

## 2019-05-15 DIAGNOSIS — Z3041 Encounter for surveillance of contraceptive pills: Secondary | ICD-10-CM | POA: Diagnosis not present

## 2019-05-15 DIAGNOSIS — Z1389 Encounter for screening for other disorder: Secondary | ICD-10-CM | POA: Diagnosis not present

## 2019-05-22 DIAGNOSIS — R350 Frequency of micturition: Secondary | ICD-10-CM | POA: Diagnosis not present

## 2019-06-01 DIAGNOSIS — J3089 Other allergic rhinitis: Secondary | ICD-10-CM | POA: Diagnosis not present

## 2019-06-01 DIAGNOSIS — T781XXD Other adverse food reactions, not elsewhere classified, subsequent encounter: Secondary | ICD-10-CM | POA: Diagnosis not present

## 2019-06-01 DIAGNOSIS — E739 Lactose intolerance, unspecified: Secondary | ICD-10-CM | POA: Diagnosis not present

## 2019-07-06 DIAGNOSIS — Z20828 Contact with and (suspected) exposure to other viral communicable diseases: Secondary | ICD-10-CM | POA: Diagnosis not present

## 2019-07-13 ENCOUNTER — Telehealth (INDEPENDENT_AMBULATORY_CARE_PROVIDER_SITE_OTHER): Payer: BC Managed Care – PPO | Admitting: Primary Care

## 2019-07-13 ENCOUNTER — Encounter: Payer: Self-pay | Admitting: Primary Care

## 2019-07-13 DIAGNOSIS — R112 Nausea with vomiting, unspecified: Secondary | ICD-10-CM

## 2019-07-13 DIAGNOSIS — R197 Diarrhea, unspecified: Secondary | ICD-10-CM | POA: Diagnosis not present

## 2019-07-13 DIAGNOSIS — R109 Unspecified abdominal pain: Secondary | ICD-10-CM

## 2019-07-13 HISTORY — DX: Nausea with vomiting, unspecified: R11.2

## 2019-07-13 NOTE — Assessment & Plan Note (Signed)
Chronic and intermittent since 2016, increased frequency over the last one year.  Following with allergist with Alpha Gal testing pending, otherwise other testing unremarkable. We will have her contact the allergist to send over recent lab testing.  Start with abdominal ultrasound to rule out gall bladder involvement. Referral placed to GI for further evaluation.

## 2019-07-13 NOTE — Assessment & Plan Note (Signed)
Chronic and intermittent since 2016, increased frequency over the last one year.  Following with allergist with Alpha Gal testing pending, otherwise other testing unremarkable. We will have her contact the allergist to send over recent lab testing.  Start with abdominal ultrasound to rule out gall bladder involvement. Referral placed to GI for further evaluation.  

## 2019-07-13 NOTE — Progress Notes (Signed)
Subjective:    Patient ID: Lorraine Wallace, female    DOB: 09-05-1996, 23 y.o.   MRN: [id number]  HPI  Virtual Visit via Video Note  I connected with Lorraine Wallace on 07/13/19 at 10:20 AM EST by a video enabled telemedicine application and verified that I am speaking with the correct person using two identifiers.  Location: Patient: Home Provider: Office   I discussed the limitations of evaluation and management by telemedicine and the availability of in person appointments. The patient expressed understanding and agreed to proceed.  History of Present Illness:  Lorraine Wallace is a 23 year old female who presents today with a chief complaint of abdominal pain.  Chronic history of random GI symptoms associated with certain food that began in 2016. Symptoms typically consist of nausea, vomiting, diarrhea, abdominal cramping. Since late 2018 or early 2019 she's had these symptoms occur once every 1-3 months with random food. Her last bout of these symptoms was 2-3 months ago except for recently.   Two weekends ago she had some GI symptoms. She ate the same type of meal for breakfast, lunch, and dinner for three days in a row, had nausea, diarrhea, fatigue, decreased appetite. Since then she's had very bland food including crackers, peanut butter, white rice. Two days ago she ate a subway sandwich and chili, did well and is feeling better today.  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 She was tested for Alpha Gal, has not heard from those results. It was recommended that she keep a food diary for the next month for which she hasn't done. Has GI upset with cramping and diarrhea if she does eat. She doesn't have problems with cooked tomatoes.   She's been talking with a friend who has Celiac disease and is interested in testing. She mentioned this to her allergist who recommended GI. Foods that have caused these symptoms have  been [place] take out, pasta with alfredo sauce, chicken, tacos. She's eaten these foods in the past without symptoms. She denies esophageal reflux, belching, bloody stools, fevers.    Observations/Objective:  Alert and oriented. Appears well, not sickly. No distress. Speaking in complete sentences.   Assessment and Plan:  See problem based charting.  Follow Up Instructions:  You will be contacted regarding the ultrasound and GI evaluation.  Please let us know if you have not been contacted within two weeks.   It was a pleasure to see you today! Allie Bossier, NP-C    I discussed the assessment and treatment plan with the patient. The patient was provided an opportunity to ask questions and all were answered. The patient agreed with the plan and demonstrated an understanding of the instructions.   The patient was advised to call back or seek an in-person evaluation if the symptoms worsen or if the condition fails to improve as anticipated.    Pleas Koch, NP    Review of Systems  Constitutional: Negative for unexpected weight change.  Gastrointestinal: Positive for abdominal pain.       See HPI        Past Medical History:  Diagnosis Date  . Chicken pox      Social History   Socioeconomic History  . Marital status: Married    Spouse name: Not on file  . Number of children: Not on file  . Years of education: Not on file  . Highest education level: Not on  file  Occupational History  . Not on file  Social Needs  . Financial resource strain: Not on file  . Food insecurity    Worry: Not on file    Inability: Not on file  . Transportation needs    Medical: Not on file    Non-medical: Not on file  Tobacco Use  . Smoking status: Never Smoker  . Smokeless tobacco: Never Used  Substance and Sexual Activity  . Alcohol use: Yes    Alcohol/week: 0.0 standard drinks    Comment: social  . Drug use: Not on file  . Sexual activity: Not on file  Lifestyle  .  Physical activity    Days per week: Not on file    Minutes per session: Not on file  . Stress: Not on file  Relationships  . Social Musician on phone: Not on file    Gets together: Not on file    Attends religious service: Not on file    Active member of club or organization: Not on file    Attends meetings of clubs or organizations: Not on file    Relationship status: Not on file  . Intimate partner violence    Fear of current or ex partner: Not on file    Emotionally abused: Not on file    Physically abused: Not on file    Forced sexual activity: Not on file  Other Topics Concern  . Not on file  Social History Narrative   Single.   Highest level of education Associates.   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   Enjoys Diplomatic Services operational officer, shopping, going out to [employer].     No past surgical history on file.  Family History  Problem Relation Age of Onset  . Lung cancer Maternal Grandmother   . Stroke Maternal Grandmother   . Asthma Mother     No Known Allergies  Current Outpatient Medications on File Prior to Visit  Medication Sig Dispense Refill  . norethindrone-ethinyl estradiol (JUNEL FE,GILDESS FE,LOESTRIN FE) 1-20 MG-MCG tablet Take 1 tablet by mouth daily.      No current facility-administered medications on file prior to visit.     There were no vitals taken for this visit.   Objective:   Physical Exam  Constitutional: She appears well-nourished. She does not have a sickly appearance.  Respiratory: Effort normal.  Neurological: She is alert.  Psychiatric: She has a normal mood and affect.           Assessment & Plan:

## 2019-07-15 ENCOUNTER — Encounter: Payer: Self-pay | Admitting: Primary Care

## 2019-07-20 ENCOUNTER — Encounter: Payer: Self-pay | Admitting: *Deleted

## 2019-07-22 ENCOUNTER — Ambulatory Visit
Admission: RE | Admit: 2019-07-22 | Discharge: 2019-07-22 | Disposition: A | Discharge status: Home / Self Care | Payer: BC Managed Care – PPO | Source: Ambulatory Visit | Attending: Primary Care | Admitting: Primary Care

## 2019-07-22 DIAGNOSIS — R109 Unspecified abdominal pain: Secondary | ICD-10-CM

## 2019-07-22 DIAGNOSIS — R112 Nausea with vomiting, unspecified: Secondary | ICD-10-CM

## 2019-07-22 DIAGNOSIS — R197 Diarrhea, unspecified: Secondary | ICD-10-CM

## 2019-07-22 DIAGNOSIS — K828 Other specified diseases of gallbladder: Secondary | ICD-10-CM | POA: Diagnosis not present

## 2019-07-23 DIAGNOSIS — R112 Nausea with vomiting, unspecified: Secondary | ICD-10-CM

## 2019-07-23 DIAGNOSIS — R197 Diarrhea, unspecified: Secondary | ICD-10-CM

## 2019-07-23 DIAGNOSIS — R109 Unspecified abdominal pain: Secondary | ICD-10-CM

## 2019-08-03 DIAGNOSIS — R310 Gross hematuria: Secondary | ICD-10-CM | POA: Diagnosis not present

## 2019-08-03 NOTE — Telephone Encounter (Signed)
Hi ladies,  Could one of you help this patient out?

## 2019-08-04 DIAGNOSIS — R197 Diarrhea, unspecified: Secondary | ICD-10-CM | POA: Diagnosis not present

## 2019-08-04 DIAGNOSIS — K828 Other specified diseases of gallbladder: Secondary | ICD-10-CM | POA: Diagnosis not present

## 2019-08-04 NOTE — Telephone Encounter (Signed)
[facility] LMOM, 11-2 and 11-6, and sent Unable to Contact Letter 11-9.  No response, order closed.  I will send MyChart msg to pt.

## 2019-08-04 NOTE — Telephone Encounter (Signed)
Chamaine, can you reach out to patient via my chart with this information? She responds via my chart and we may have the incorrect phone number.

## 2019-08-04 NOTE — Telephone Encounter (Signed)
Pt responded back to me via MyChart. Rosaria Ferries was able to schedule [email or web address].  (see other referral 08-03-19).  [facility] Referral closed.

## 2019-08-05 DIAGNOSIS — R197 Diarrhea, unspecified: Secondary | ICD-10-CM | POA: Diagnosis not present

## 2019-08-14 DIAGNOSIS — R197 Diarrhea, unspecified: Secondary | ICD-10-CM | POA: Diagnosis not present

## 2019-08-14 DIAGNOSIS — R310 Gross hematuria: Secondary | ICD-10-CM | POA: Diagnosis not present

## 2019-08-21 DIAGNOSIS — Z20828 Contact with and (suspected) exposure to other viral communicable diseases: Secondary | ICD-10-CM | POA: Diagnosis not present

## 2019-08-31 DIAGNOSIS — R10817 Generalized abdominal tenderness: Secondary | ICD-10-CM | POA: Diagnosis not present

## 2019-08-31 DIAGNOSIS — R110 Nausea: Secondary | ICD-10-CM | POA: Diagnosis not present

## 2019-08-31 DIAGNOSIS — R109 Unspecified abdominal pain: Secondary | ICD-10-CM | POA: Diagnosis not present

## 2019-09-08 ENCOUNTER — Ambulatory Visit (INDEPENDENT_AMBULATORY_CARE_PROVIDER_SITE_OTHER): Payer: BC Managed Care – PPO | Admitting: Family Medicine

## 2019-09-08 ENCOUNTER — Other Ambulatory Visit: Payer: Self-pay

## 2019-09-08 ENCOUNTER — Encounter: Payer: Self-pay | Admitting: Family Medicine

## 2019-09-08 DIAGNOSIS — Z20828 Contact with and (suspected) exposure to other viral communicable diseases: Secondary | ICD-10-CM | POA: Diagnosis not present

## 2019-09-08 DIAGNOSIS — N760 Acute vaginitis: Secondary | ICD-10-CM

## 2019-09-08 LAB — POCT WET PREP (WET MOUNT): Trichomonas Wet Prep HPF POC: ABSENT

## 2019-09-08 MED ORDER — METRONIDAZOLE 0.75 % VA GEL: 1.0000 | Freq: Every day | VAGINAL | 0 refills | Status: DC

## 2019-09-08 NOTE — Assessment & Plan Note (Signed)
Pt presented with vaginal odor after tampon use (shortly following cessation of OC)  No retained tampon seen Clue cells on wet prep  Reassured - and tx for BV by meto gel vaginal  Update if not starting to improve in a week or if worsening   Probiotics may help restore balance as well

## 2019-09-08 NOTE — Patient Instructions (Signed)
I think you may have bacterial vaginosis - that can cause irritation and odor   Use the metro gel vaginal treatment as directed and let me know if symptoms do not improve

## 2019-09-08 NOTE — Progress Notes (Signed)
Subjective:    Patient ID: Lorraine Wallace, female    DOB: 14-Mar-1996, 23 y.o.   MRN: [id number]  This visit occurred during the SARS-CoV-2 public health emergency.  Safety protocols were in place, including screening questions prior to the visit, additional usage of staff PPE, and extensive cleaning of exam room while observing appropriate contact time as indicated for disinfecting solutions.    HPI Pt presents with question of retained tampon   23 yo pt of NP Clark  LMP started 12/22  When she took last tampon out- there is a strong odor  She tried to see and could not tell  ? If she forgot to take one out and put another one in   Some low back pain but not pelvic pain  No vaginal d/c or burning or itching   Not on any birth control currently  Saw gyn last Monday- is holding OC to see if it caused nausea   Monogamous Does not desire STD testing    Patient Active Problem List   Diagnosis Date Noted  . Vaginitis 09/08/2019  . Abdominal cramping 07/13/2019  . Nausea vomiting and diarrhea 07/13/2019   Past Medical History:  Diagnosis Date  . Chicken pox   . Urinary frequency 03/04/2019   History reviewed. No pertinent surgical history. Social History   Tobacco Use  . Smoking status: Never Smoker  . Smokeless tobacco: Never Used  Substance Use Topics  . Alcohol use: Yes    Alcohol/week: 0.0 standard drinks    Comment: social  . Drug use: Not on file   Family History  Problem Relation Age of Onset  . Lung cancer Maternal Grandmother   . Stroke Maternal Grandmother   . Asthma Mother    Allergies  Allergen Reactions  . Avocado   . Tomato Extract Allergy Skin Test    No current outpatient medications on file prior to visit.   No current facility-administered medications on file prior to visit.     Review of Systems  Constitutional: Negative for activity change, appetite change, fatigue, fever and unexpected weight change.  HENT: Negative for  congestion, ear pain, rhinorrhea, sinus pressure and sore throat.   Eyes: Negative for pain, redness and visual disturbance.  Respiratory: Negative for cough, shortness of breath and wheezing.   Cardiovascular: Negative for chest pain and palpitations.  Gastrointestinal: Negative for abdominal pain, blood in stool, constipation and diarrhea.  Endocrine: Negative for polydipsia and polyuria.  Genitourinary: Negative for dysuria, frequency, urgency, vaginal bleeding, vaginal discharge and vaginal pain.       Vaginal odor  Musculoskeletal: Negative for arthralgias, back pain and myalgias.  Skin: Negative for pallor and rash.  Allergic/Immunologic: Negative for environmental allergies.  Neurological: Negative for dizziness, syncope and headaches.  Hematological: Negative for adenopathy. Does not bruise/bleed easily.  Psychiatric/Behavioral: Negative for decreased concentration and dysphoric mood. The patient is not nervous/anxious.        Objective:   Physical Exam Constitutional:      General: She is not in acute distress.    Appearance: Normal appearance. She is normal weight. She is not ill-appearing.     Comments: slim  HENT:     Head: Normocephalic and atraumatic.  Eyes:     General: No scleral icterus.    Conjunctiva/sclera: Conjunctivae normal.     Pupils: Pupils are equal, round, and reactive to light.  Cardiovascular:     Rate and Rhythm: Normal rate and regular rhythm.  Heart sounds: Normal heart sounds.  Pulmonary:     Effort: Pulmonary effort is normal. No respiratory distress.     Breath sounds: Normal breath sounds. No wheezing or rales.  Genitourinary:    Labia:        Right: No rash, tenderness or lesion.        Left: No rash, tenderness or lesion.      Vagina: No foreign body. Vaginal discharge present. No erythema, tenderness, bleeding or lesions.     Cervix: No cervical motion tenderness, friability or erythema.     Comments: Nl appearing vaginal mucosa    Scant pale d/c  No odor   Wet prep - pos for clue cells and wbc  Neurological:     Mental Status: She is alert.  Psychiatric:        Mood and Affect: Mood normal.           Assessment & Plan:   Problem List Items Addressed This Visit      Genitourinary   Vaginitis    Pt presented with vaginal odor after tampon use (shortly following cessation of OC)  No retained tampon seen Clue cells on wet prep  Reassured - and tx for BV by meto gel vaginal  Update if not starting to improve in a week or if worsening   Probiotics may help restore balance as well      Relevant Orders   POCT Wet Prep Mell[facility]) (Completed)

## 2019-09-22 DIAGNOSIS — R102 Pelvic and perineal pain: Secondary | ICD-10-CM | POA: Diagnosis not present

## 2019-09-22 DIAGNOSIS — N898 Other specified noninflammatory disorders of vagina: Secondary | ICD-10-CM | POA: Diagnosis not present

## 2019-09-23 DIAGNOSIS — L0101 Non-bullous impetigo: Secondary | ICD-10-CM | POA: Diagnosis not present

## 2019-09-23 DIAGNOSIS — L218 Other seborrheic dermatitis: Secondary | ICD-10-CM | POA: Diagnosis not present

## 2019-09-23 DIAGNOSIS — K130 Diseases of lips: Secondary | ICD-10-CM | POA: Diagnosis not present

## 2019-10-01 DIAGNOSIS — R102 Pelvic and perineal pain: Secondary | ICD-10-CM | POA: Diagnosis not present

## 2019-10-06 DIAGNOSIS — K639 Disease of intestine, unspecified: Secondary | ICD-10-CM | POA: Diagnosis not present

## 2019-10-06 DIAGNOSIS — R110 Nausea: Secondary | ICD-10-CM | POA: Diagnosis not present

## 2019-10-16 ENCOUNTER — Ambulatory Visit: Payer: BC Managed Care – PPO | Admitting: Primary Care

## 2019-10-16 ENCOUNTER — Encounter: Payer: Self-pay | Admitting: Primary Care

## 2019-10-16 ENCOUNTER — Other Ambulatory Visit: Payer: Self-pay

## 2019-10-16 ENCOUNTER — Ambulatory Visit (INDEPENDENT_AMBULATORY_CARE_PROVIDER_SITE_OTHER)
Admission: RE | Admit: 2019-10-16 | Discharge: 2019-10-16 | Disposition: A | Discharge status: Home / Self Care | Payer: BC Managed Care – PPO | Source: Ambulatory Visit | Attending: Primary Care | Admitting: Primary Care

## 2019-10-16 VITALS — BP 108/72 | HR 108 | Temp 96.7°F | Ht 67.25 in | Wt 112.2 lb

## 2019-10-16 DIAGNOSIS — R0789 Other chest pain: Secondary | ICD-10-CM | POA: Diagnosis not present

## 2019-10-16 HISTORY — DX: Other chest pain: R07.89

## 2019-10-16 LAB — BASIC METABOLIC PANEL
BUN: 9 mg/dL (ref 6–23)
CO2: 28 mEq/L (ref 19–32)
Calcium: 9.6 mg/dL (ref 8.4–10.5)
Chloride: 104 mEq/L (ref 96–112)
Creatinine, Ser: 0.82 mg/dL (ref 0.40–1.20)
GFR: 84.52 mL/min (ref >60.00)
Glucose, Bld: 87 mg/dL (ref 70–99)
Potassium: 4 mEq/L (ref 3.5–5.1)
Sodium: 140 mEq/L (ref 135–145)

## 2019-10-16 LAB — CBC
HCT: 41 % (ref 36.0–46.0)
Hemoglobin: 14.6 g/dL (ref 12.0–15.0)
MCHC: 35.5 g/dL (ref 30.0–36.0)
MCV: 93.4 fl (ref 78.0–100.0)
Platelets: 194 10*3/uL (ref 150.0–400.0)
RBC: 4.39 Mil/uL (ref 3.87–5.11)
RDW: 12.9 % (ref 11.5–15.5)
WBC: 6 10*3/uL (ref 4.0–10.5)

## 2019-10-16 LAB — TSH: TSH: 0.64 u[IU]/mL (ref 0.35–4.50)

## 2019-10-16 NOTE — Progress Notes (Signed)
Subjective:    Patient ID: Lorraine Wallace, female    DOB: 09/23/1995, 24 y.o.   MRN: [id number]  HPI  This visit occurred during the SARS-CoV-2 public health emergency.  Safety protocols were in place, including screening questions prior to the visit, additional usage of staff PPE, and extensive cleaning of exam room while observing appropriate contact time as indicated for disinfecting solutions.   Lorraine Wallace is a 24 year old female with a history of chronic GI upset, Covid-19 infection since 2016 who presents today with a chief complaint of nausea.  Symptoms began last week one day after drinking coffee at [facility]. The following day she developed nausea, feeling as though she would have diarrhea. She brushed her symptoms off for a few days which continued to wax and wane. Yesterday she developed a burning, "hot feeling" to the center of her chest after eating saltine crackers. Symptoms persisted all day into the evening hours. She took some antacids without improvement, then went to famotidine with improvement. Also took a nap during the day yesterday, this seemed to help.  Today symptoms are better but is feeling more chest tightness to the same area in her chest.   She was last evaluated for her chronic GI symptoms in late November 2020, referred to [facility] at that time. She was evaluated by GI in December 2020, underwent fecal testing, blood work and everything was negative. She followed up with her GI doctor last week, she stopped her birth control to see if that would help with symptoms. She inially felt better but did develop an ovarian cyst so she's resuming birth control. There are no current plans for endoscopy or colonoscopy.  Review of Systems  Respiratory: Positive for chest tightness.   Cardiovascular:       Chest pressure  Gastrointestinal: Positive for nausea. Negative for constipation, diarrhea and vomiting.       Chest burning, GI upset       Past Medical  History:  Diagnosis Date  . Chicken pox   . Urinary frequency 03/04/2019     Social History   Socioeconomic History  . Marital status: Married    Spouse name: Not on file  . Number of children: Not on file  . Years of education: Not on file  . Highest education level: Not on file  Occupational History  . Not on file  Tobacco Use  . Smoking status: Never Smoker  . Smokeless tobacco: Never Used  Substance and Sexual Activity  . Alcohol use: Yes    Alcohol/week: 0.0 standard drinks    Comment: social  . Drug use: Not on file  . Sexual activity: Not on file  Other Topics Concern  . Not on file  Social History Narrative   Single.   Highest level of education Associates.   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   Enjoys Scientist, water quality, shopping, going out to [employer]    Social Determinants of Health   Financial Resource Strain:   . Difficulty of Paying Living Expenses: Not on file  Food Insecurity:   . Worried About Charity fundraiser in the Last Year: Not on file  . Ran Out of Food in the Last Year: Not on file  Transportation Needs:   . Lack of Transportation (Medical): Not on file  . Lack of Transportation (Non-Medical): Not on file  Physical Activity:   . Days of Exercise per Week: Not on file  . Minutes of Exercise per Session:  Not on file  Stress:   . Feeling of Stress : Not on file  Social Connections:   . Frequency of Communication with Friends and Family: Not on file  . Frequency of Social Gatherings with Friends and Family: Not on file  . Attends Religious Services: Not on file  . Active Member of Clubs or Organizations: Not on file  . Attends Banker Meetings: Not on file  . Marital Status: Not on file  Intimate Partner Violence:   . Fear of Current or Ex-Partner: Not on file  . Emotionally Abused: Not on file  . Physically Abused: Not on file  . Sexually Abused: Not on file    No past surgical history on file.  Family History  Problem  Relation Age of Onset  . Lung cancer Maternal Grandmother   . Stroke Maternal Grandmother   . Asthma Mother     Allergies  Allergen Reactions  . Avocado   . Tomato Extract Allergy Skin Test     No current outpatient medications on file prior to visit.   No current facility-administered medications on file prior to visit.    BP 108/72   Pulse (!) 108   Temp (!) 96.7 F (35.9 C) (Temporal)   Ht 5' 7.25" (1.708 m)   Wt 112 lb 4 oz (50.9 kg)   LMP 09/27/2019   SpO2 95%   BMI 17.45 kg/m    Objective:   Physical Exam  Constitutional: She appears well-nourished.  Cardiovascular: Normal rate and regular rhythm.  Respiratory: Effort normal and breath sounds normal.  GI: Soft. Bowel sounds are normal. There is no abdominal tenderness.  Musculoskeletal:     Cervical back: Neck supple.  Skin: Skin is warm and dry.  Psychiatric: She has a normal mood and affect.           Assessment & Plan:

## 2019-10-16 NOTE — Patient Instructions (Signed)
Continue famotidine 20 mg once daily for reflux.  Touch base with your GI doctor if your symptoms persist.  It was a pleasure to see you today!

## 2019-10-16 NOTE — Assessment & Plan Note (Addendum)
Sounds more so to be secondary to reflux, especially given symptoms yesterday. She has had improvement with famotidine.   ECG today with NSR, rate of 83. There was rate variation noted at the end. My CMA stated that there was no alternating quicker then slower rate. No PAC/PVC. No prior to compare.  Check chest xray, labs.  Will have her continue famotidine and follow up with GI if symptoms persist.

## 2019-10-17 LAB — D-DIMER, QUANTITATIVE: D-Dimer, Quant: <0.19 mcg/mL FEU (ref <0.50)

## 2019-10-19 DIAGNOSIS — T781XXA Other adverse food reactions, not elsewhere classified, initial encounter: Secondary | ICD-10-CM

## 2019-10-21 DIAGNOSIS — L0101 Non-bullous impetigo: Secondary | ICD-10-CM | POA: Diagnosis not present

## 2019-10-21 DIAGNOSIS — L218 Other seborrheic dermatitis: Secondary | ICD-10-CM | POA: Diagnosis not present

## 2019-10-21 DIAGNOSIS — L0109 Other impetigo: Secondary | ICD-10-CM | POA: Diagnosis not present

## 2019-10-21 DIAGNOSIS — K130 Diseases of lips: Secondary | ICD-10-CM | POA: Diagnosis not present

## 2019-10-28 NOTE — Telephone Encounter (Signed)
Can you give the patient a time frame when she should hear back for the referral?

## 2019-11-18 DIAGNOSIS — Z91018 Allergy to other foods: Secondary | ICD-10-CM | POA: Diagnosis not present

## 2019-11-18 DIAGNOSIS — R112 Nausea with vomiting, unspecified: Secondary | ICD-10-CM | POA: Diagnosis not present

## 2019-12-02 DIAGNOSIS — L0101 Non-bullous impetigo: Secondary | ICD-10-CM | POA: Diagnosis not present

## 2019-12-02 DIAGNOSIS — B081 Molluscum contagiosum: Secondary | ICD-10-CM | POA: Diagnosis not present

## 2019-12-02 DIAGNOSIS — L218 Other seborrheic dermatitis: Secondary | ICD-10-CM | POA: Diagnosis not present

## 2019-12-02 DIAGNOSIS — L2084 Intrinsic (allergic) eczema: Secondary | ICD-10-CM | POA: Diagnosis not present

## 2019-12-09 DIAGNOSIS — N921 Excessive and frequent menstruation with irregular cycle: Secondary | ICD-10-CM | POA: Diagnosis not present

## 2019-12-09 DIAGNOSIS — Z113 Encounter for screening for infections with a predominantly sexual mode of transmission: Secondary | ICD-10-CM | POA: Diagnosis not present

## 2019-12-22 DIAGNOSIS — A630 Anogenital (venereal) warts: Secondary | ICD-10-CM | POA: Diagnosis not present

## 2019-12-22 DIAGNOSIS — L2084 Intrinsic (allergic) eczema: Secondary | ICD-10-CM | POA: Diagnosis not present

## 2019-12-22 DIAGNOSIS — L218 Other seborrheic dermatitis: Secondary | ICD-10-CM | POA: Diagnosis not present

## 2020-01-12 DIAGNOSIS — R112 Nausea with vomiting, unspecified: Secondary | ICD-10-CM | POA: Diagnosis not present

## 2020-01-12 DIAGNOSIS — R1084 Generalized abdominal pain: Secondary | ICD-10-CM | POA: Diagnosis not present

## 2020-01-12 DIAGNOSIS — R197 Diarrhea, unspecified: Secondary | ICD-10-CM | POA: Diagnosis not present

## 2020-01-17 DIAGNOSIS — Z20822 Contact with and (suspected) exposure to covid-19: Secondary | ICD-10-CM | POA: Diagnosis not present

## 2020-01-17 DIAGNOSIS — Z01812 Encounter for preprocedural laboratory examination: Secondary | ICD-10-CM | POA: Diagnosis not present

## 2020-01-20 DIAGNOSIS — R110 Nausea: Secondary | ICD-10-CM | POA: Diagnosis not present

## 2020-01-20 DIAGNOSIS — R109 Unspecified abdominal pain: Secondary | ICD-10-CM | POA: Diagnosis not present

## 2020-01-20 DIAGNOSIS — K449 Diaphragmatic hernia without obstruction or gangrene: Secondary | ICD-10-CM | POA: Diagnosis not present

## 2020-01-20 DIAGNOSIS — R112 Nausea with vomiting, unspecified: Secondary | ICD-10-CM | POA: Diagnosis not present

## 2020-01-20 DIAGNOSIS — Z79899 Other long term (current) drug therapy: Secondary | ICD-10-CM | POA: Diagnosis not present

## 2020-01-20 DIAGNOSIS — R197 Diarrhea, unspecified: Secondary | ICD-10-CM | POA: Diagnosis not present

## 2020-01-20 DIAGNOSIS — Z793 Long term (current) use of hormonal contraceptives: Secondary | ICD-10-CM | POA: Diagnosis not present

## 2020-01-20 NOTE — Telephone Encounter (Signed)
Irving Burton, can you get her scheduled? Thanks!

## 2020-01-20 NOTE — Telephone Encounter (Signed)
Lvm asking pt to call office 

## 2020-01-25 ENCOUNTER — Other Ambulatory Visit: Payer: Self-pay

## 2020-01-25 ENCOUNTER — Ambulatory Visit: Payer: BC Managed Care – PPO | Admitting: Primary Care

## 2020-01-25 ENCOUNTER — Encounter: Payer: Self-pay | Admitting: Primary Care

## 2020-01-25 VITALS — BP 112/68 | HR 98 | Temp 96.6°F | Ht 67.25 in | Wt 112.5 lb

## 2020-01-25 DIAGNOSIS — R109 Unspecified abdominal pain: Secondary | ICD-10-CM | POA: Diagnosis not present

## 2020-01-25 DIAGNOSIS — R8290 Unspecified abnormal findings in urine: Secondary | ICD-10-CM | POA: Diagnosis not present

## 2020-01-25 DIAGNOSIS — F419 Anxiety disorder, unspecified: Secondary | ICD-10-CM | POA: Insufficient documentation

## 2020-01-25 DIAGNOSIS — R1084 Generalized abdominal pain: Secondary | ICD-10-CM

## 2020-01-25 DIAGNOSIS — F411 Generalized anxiety disorder: Secondary | ICD-10-CM | POA: Diagnosis not present

## 2020-01-25 LAB — POC URINALSYSI DIPSTICK (AUTOMATED)
Bilirubin, UA: NEGATIVE
Blood, UA: NEGATIVE
Glucose, UA: NEGATIVE
Ketones, UA: NEGATIVE
Leukocytes, UA: NEGATIVE
Nitrite, UA: NEGATIVE
Protein, UA: NEGATIVE
Spec Grav, UA: 1.015 (ref 1.010–1.025)
Urobilinogen, UA: 0.2 E.U./dL
pH, UA: 6 (ref 5.0–8.0)

## 2020-01-25 NOTE — Patient Instructions (Signed)
You will be contacted regarding your repeat ultrasound.  Please let us know if you have not been contacted within two weeks.   I recommend we start low dose sertraline (Zoloft) or escitalopram (Lexapro) for anxiety. Please notify me once you've decided.  It was a pleasure to see you today!

## 2020-01-25 NOTE — Progress Notes (Signed)
Subjective:    Patient ID: Lorraine Wallace, female    DOB: [birth year], 24 y.o.   MRN: [id number]  HPI  This visit occurred during the SARS-CoV-2 public health emergency.  Safety protocols were in place, including screening questions prior to the visit, additional usage of staff PPE, and extensive cleaning of exam room while observing appropriate contact time as indicated for disinfecting solutions.   Lorraine Wallace is a 24 year old female with a history of nausea, vomiting, and diarrhea, chest pressure, abdomina pain who presents today with several issues to discuss.  1) Chronic Abdominal Pain: Chronic for nearly one year, that is located to different regions of her abdomen at various times. Ultrasound completed in November 2020 with small amount of sludge to gall bladder without other abnormality. She would like a repeat ultrasound to evaluate for any change.   She completed an upper endoscopy last week which was unremarkable. She is following with GI through [facility] who is suspecting cyclical nausea and vomiting syndrome. She was prescribed sumatriptan nasally and Zofran orally and hasn't noticed improvement. It was recommended to start amitriptyline nightly to see if this helps with symptoms.   She has noticed an odd smell to her urine since her Covid-19 diagnosis. She can smell peanuts when she urinates. She did lose her sense of smell during Covid infection, has since regained some but continues to notice different smells of familiar things.    2) Anxiety: Chronic for the last 1-2 years. Symptoms include palpitations, worry, over thinking things, mind racing thoughts, irritability, mood changes, poor memory. Previously following with therapy which helps somewhat, actually resumed therapy last week. GAD 7 score of 14 today. She would like to consider medication treatment.   Review of Systems  Constitutional: Negative for fever.  Gastrointestinal: Positive for abdominal pain and nausea.    Psychiatric/Behavioral: The patient is nervous/anxious.        See HPI       Past Medical History:  Diagnosis Date  . Chicken pox   . Urinary frequency 03/04/2019     Social History   Socioeconomic History  . Marital status: Married    Spouse name: Not on file  . Number of children: Not on file  . Years of education: Not on file  . Highest education level: Not on file  Occupational History  . Not on file  Tobacco Use  . Smoking status: Never Smoker  . Smokeless tobacco: Never Used  Substance and Sexual Activity  . Alcohol use: Yes    Alcohol/week: 0.0 standard drinks    Comment: social  . Drug use: Not on file  . Sexual activity: Not on file  Other Topics Concern  . Not on file  Social History Narrative   Single.   Highest level of education Associates.   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   Enjoys Diplomatic Services operational officer, shopping, going out to [employer].    Social Determinants of Health   Financial Resource Strain:   . Difficulty of Paying Living Expenses:   Food Insecurity:   . Worried About Programme researcher, broadcasting/film/video in the Last Year:   . Barista in the Last Year:   Transportation Needs:   . Freight forwarder (Medical):   Marland Kitchen Lack of Transportation (Non-Medical):   Physical Activity:   . Days of Exercise per Week:   . Minutes of Exercise per Session:   Stress:   . Feeling of Stress :   Social Connections:   .  Frequency of Communication with Friends and Family:   . Frequency of Social Gatherings with Friends and Family:   . Attends Religious Services:   . Active Member of Clubs or Organizations:   . Attends Archivist Meetings:   Marland Kitchen Marital Status:   Intimate Partner Violence:   . Fear of Current or Ex-Partner:   . Emotionally Abused:   Marland Kitchen Physically Abused:   . Sexually Abused:     No past surgical history on file.  Family History  Problem Relation Age of Onset  . Lung cancer Maternal Grandmother   . Stroke Maternal Grandmother   . Asthma  Mother     Allergies  Allergen Reactions  . Avocado   . Tomato Extract Allergy Skin Test     Current Outpatient Medications on File Prior to Visit  Medication Sig Dispense Refill  . JUNEL 1/20 1-20 MG-MCG tablet Take 1 tablet by mouth daily.     No current facility-administered medications on file prior to visit.    BP 112/68   Pulse 98   Temp (!) 96.6 F (35.9 C) (Temporal)   Ht 5' 7.25" (1.708 m)   Wt 112 lb 8 oz (51 kg)   LMP 01/03/2020   SpO2 98%   BMI 17.49 kg/m    Objective:   Physical Exam  Constitutional: She appears well-nourished.  Cardiovascular: Normal rate and regular rhythm.  Respiratory: Effort normal and breath sounds normal.  GI: Soft. Bowel sounds are normal. There is no abdominal tenderness.  Musculoskeletal:     Cervical back: Neck supple.  Skin: Skin is warm and dry.  Psychiatric: She has a normal mood and affect.           Assessment & Plan:

## 2020-01-25 NOTE — Assessment & Plan Note (Signed)
Continued without resolve or any insight into cause of symptoms. Following with GI through [facility].   Repeat US of gall bladder ordered.  Upper endoscopy unremarkable except for small hiatal hernia. Labs from February 2021 reviewed.   Await results.

## 2020-01-25 NOTE — Assessment & Plan Note (Signed)
Chronic for the last 1-2 years, therapy helps at times. Considering medication treatment. Discussed options and recommended either Zoloft or Lexapro, she will think about this and get back to me tomorrow.  Discussed potential side effects. Recommend to follow up in 6 weeks after initiation.

## 2020-01-26 DIAGNOSIS — F411 Generalized anxiety disorder: Secondary | ICD-10-CM

## 2020-01-26 MED ORDER — ESCITALOPRAM OXALATE 10 MG PO TABS: 10.0000 mg | ORAL_TABLET | Freq: Every day | ORAL | 1 refills | Status: DC

## 2020-01-29 ENCOUNTER — Ambulatory Visit
Admission: RE | Admit: 2020-01-29 | Discharge: 2020-01-29 | Disposition: A | Discharge status: Home / Self Care | Payer: BC Managed Care – PPO | Source: Ambulatory Visit | Attending: Primary Care | Admitting: Primary Care

## 2020-01-29 DIAGNOSIS — R1084 Generalized abdominal pain: Secondary | ICD-10-CM

## 2020-02-12 ENCOUNTER — Other Ambulatory Visit: Payer: Self-pay

## 2020-02-12 ENCOUNTER — Ambulatory Visit: Payer: BC Managed Care – PPO | Admitting: Primary Care

## 2020-02-12 ENCOUNTER — Encounter: Payer: Self-pay | Admitting: Primary Care

## 2020-02-12 VITALS — BP 100/66 | HR 84 | Temp 95.6°F | Ht 67.25 in | Wt 110.0 lb

## 2020-02-12 DIAGNOSIS — N898 Other specified noninflammatory disorders of vagina: Secondary | ICD-10-CM | POA: Insufficient documentation

## 2020-02-12 NOTE — Progress Notes (Signed)
Subjective:    Patient ID: Lorraine Wallace, female    DOB: 1996/06/20, 24 y.o.   MRN: [id number]  HPI  This visit occurred during the SARS-CoV-2 public health emergency.  Safety protocols were in place, including screening questions prior to the visit, additional usage of staff PPE, and extensive cleaning of exam room while observing appropriate contact time as indicated for disinfecting solutions.   Lorraine Wallace is a 24 year old female with a history of vaginitis, GAD, chronic abdominal pain, chest pressure who presents today with a chief complaint of vaginal discharge.   She's noticed an increase in vaginal discharge intermittently over the last 2-3 days, slight "tinge" of green color. Typically she doesn't notice much discharge so this is a change.   She denies foul smell, vaginal itching, urinary frequency, dysuria, pelvic pressure, abdominal pain. She is managed on OCP's for which she takes consistently, breaks for menses every 3 months.   Review of Systems  Gastrointestinal: Negative for abdominal pain.  Genitourinary: Positive for vaginal discharge. Negative for dysuria, frequency, hematuria and pelvic pain.       Past Medical History:  Diagnosis Date  . Chicken pox   . Urinary frequency 03/04/2019     Social History   Socioeconomic History  . Marital status: Married    Spouse name: Not on file  . Number of children: Not on file  . Years of education: Not on file  . Highest education level: Not on file  Occupational History  . Not on file  Tobacco Use  . Smoking status: Never Smoker  . Smokeless tobacco: Never Used  Substance and Sexual Activity  . Alcohol use: Yes    Alcohol/week: 0.0 standard drinks    Comment: social  . Drug use: Not on file  . Sexual activity: Not on file  Other Topics Concern  . Not on file  Social History Narrative   Single.   Highest level of education Associates.   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   Enjoys Scientist, water quality, shopping,  going out to [employer]    Social Determinants of Health   Financial Resource Strain:   . Difficulty of Paying Living Expenses:   Food Insecurity:   . Worried About Charity fundraiser in the Last Year:   . Arboriculturist in the Last Year:   Transportation Needs:   . Film/video editor (Medical):   Marland Kitchen Lack of Transportation (Non-Medical):   Physical Activity:   . Days of Exercise per Week:   . Minutes of Exercise per Session:   Stress:   . Feeling of Stress :   Social Connections:   . Frequency of Communication with Friends and Family:   . Frequency of Social Gatherings with Friends and Family:   . Attends Religious Services:   . Active Member of Clubs or Organizations:   . Attends Archivist Meetings:   Marland Kitchen Marital Status:   Intimate Partner Violence:   . Fear of Current or Ex-Partner:   . Emotionally Abused:   Marland Kitchen Physically Abused:   . Sexually Abused:     No past surgical history on file.  Family History  Problem Relation Age of Onset  . Lung cancer Maternal Grandmother   . Stroke Maternal Grandmother   . Asthma Mother     Allergies  Allergen Reactions  . Avocado Other (See Comments)  . Tomato Other (See Comments)  . Tomato Extract Allergy Skin Test     Current  Outpatient Medications on File Prior to Visit  Medication Sig Dispense Refill  . escitalopram (LEXAPRO) 10 MG tablet Take 1 tablet (10 mg total) by mouth daily. 30 tablet 1  . JUNEL 1/20 1-20 MG-MCG tablet Take 1 tablet by mouth daily.     No current facility-administered medications on file prior to visit.    BP 100/66   Pulse 84   Temp (!) 95.6 F (35.3 C) (Temporal)   Ht 5' 7.25" (1.708 m)   Wt 110 lb (49.9 kg)   LMP 01/03/2020   SpO2 95%   BMI 17.10 kg/m    Objective:   Physical Exam  Constitutional: She appears well-nourished.  Respiratory: Effort normal.  Genitourinary: There is no lesion on the right labia. There is no lesion on the left labia. Cervix exhibits  discharge. Cervix exhibits no motion tenderness.    Vaginal discharge present.     No vaginal erythema.  No erythema in the vagina.    Genitourinary Comments: Mild amount of whitish vaginal discharge noted. No foul smell.            Assessment & Plan:

## 2020-02-12 NOTE — Patient Instructions (Signed)
We will be in touch regarding your vaginal swab results.  It was a pleasure to see you today!

## 2020-02-12 NOTE — Assessment & Plan Note (Signed)
No obvious infection or abnormality on exam, send off wet prep pending. Offered STD testing for which she kindly declines.   We did discuss recurrent BV and when and when not to treat. Her last episode of BV was in December 2020.  Await results.

## 2020-02-13 LAB — WET PREP BY MOLECULAR PROBE
Candida species: NOT DETECTED
Gardnerella vaginalis: NOT DETECTED
MICRO NUMBER:: 10554500
SPECIMEN QUALITY:: ADEQUATE
Trichomonas vaginosis: NOT DETECTED

## 2020-02-14 DIAGNOSIS — R1084 Generalized abdominal pain: Secondary | ICD-10-CM | POA: Diagnosis not present

## 2020-02-14 DIAGNOSIS — R197 Diarrhea, unspecified: Secondary | ICD-10-CM | POA: Diagnosis not present

## 2020-02-16 DIAGNOSIS — B081 Molluscum contagiosum: Secondary | ICD-10-CM | POA: Diagnosis not present

## 2020-03-01 DIAGNOSIS — R932 Abnormal findings on diagnostic imaging of liver and biliary tract: Secondary | ICD-10-CM | POA: Diagnosis not present

## 2020-03-01 DIAGNOSIS — R1084 Generalized abdominal pain: Secondary | ICD-10-CM | POA: Diagnosis not present

## 2020-03-01 DIAGNOSIS — R112 Nausea with vomiting, unspecified: Secondary | ICD-10-CM | POA: Diagnosis not present

## 2020-03-02 ENCOUNTER — Other Ambulatory Visit: Payer: Self-pay

## 2020-03-02 ENCOUNTER — Ambulatory Visit: Payer: BC Managed Care – PPO | Admitting: Primary Care

## 2020-03-02 ENCOUNTER — Encounter: Payer: Self-pay | Admitting: Primary Care

## 2020-03-02 DIAGNOSIS — F411 Generalized anxiety disorder: Secondary | ICD-10-CM

## 2020-03-02 MED ORDER — ESCITALOPRAM OXALATE 10 MG PO TABS: 10.0000 mg | ORAL_TABLET | Freq: Every day | ORAL | 1 refills | Status: DC

## 2020-03-02 NOTE — Patient Instructions (Signed)
Continue Lexapro 10 mg daily for anxiety.  Touch base with me in 6 months as discussed.  It was a pleasure to see you today!

## 2020-03-02 NOTE — Assessment & Plan Note (Signed)
Improved on Lexapro, no negative side effects. Continue Lexapro for at least another 6 months, she will touch base with [place] at that point. Denies SI/HI.  Refills sent to pharmacy.

## 2020-03-02 NOTE — Progress Notes (Signed)
Subjective:    Patient ID: Lorraine Wallace, female    DOB: 1996-05-12, 24 y.o.   MRN: [id number]  HPI  This visit occurred during the SARS-CoV-2 public health emergency.  Safety protocols were in place, including screening questions prior to the visit, additional usage of staff PPE, and extensive cleaning of exam room while observing appropriate contact time as indicated for disinfecting solutions.   Lorraine Wallace is a 24 year old female with a history of abdominal cramping, anxiety disorder who presents today for follow up of anxiety.  She was last evaluated for anxiety on 01/25/20 with reports of chronic anxiety for the last 1-2 years, some improvement with therapy, but continued symptoms. We discussed options for treatment, she wanted to think about this and update later. She updated [place] shortly after and decided on Lexapro.  Since her last visit she's doing better. Positive effects from Lexapro include "feeling more stable", is not bothered by things that would cause irritability, less anxiety overall, less worry. Her GI symptoms have improved. She continues to follow with her therapist.   She denies GI upset, suicidal thoughts. She has noticed decreased libido, but overall not severe. She feels the benefits of the medication outweigh this side effect.   Review of Systems  Gastrointestinal: Negative for abdominal pain and nausea.  Neurological: Negative for headaches.  Psychiatric/Behavioral:       See HPI       Past Medical History:  Diagnosis Date  . Chicken pox   . Urinary frequency 03/04/2019     Social History   Socioeconomic History  . Marital status: Married    Spouse name: Not on file  . Number of children: Not on file  . Years of education: Not on file  . Highest education level: Not on file  Occupational History  . Not on file  Tobacco Use  . Smoking status: Never Smoker  . Smokeless tobacco: Never Used  Substance and Sexual Activity  . Alcohol use: Yes     Alcohol/week: 0.0 standard drinks    Comment: social  . Drug use: Not on file  . Sexual activity: Not on file  Other Topics Concern  . Not on file  Social History Narrative   Single.   Highest level of education Associates.   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   Enjoys Scientist, water quality, shopping, going out to [employer]    Social Determinants of Health   Financial Resource Strain:   . Difficulty of Paying Living Expenses:   Food Insecurity:   . Worried About Charity fundraiser in the Last Year:   . Arboriculturist in the Last Year:   Transportation Needs:   . Film/video editor (Medical):   Marland Kitchen Lack of Transportation (Non-Medical):   Physical Activity:   . Days of Exercise per Week:   . Minutes of Exercise per Session:   Stress:   . Feeling of Stress :   Social Connections:   . Frequency of Communication with Friends and Family:   . Frequency of Social Gatherings with Friends and Family:   . Attends Religious Services:   . Active Member of Clubs or Organizations:   . Attends Archivist Meetings:   Marland Kitchen Marital Status:   Intimate Partner Violence:   . Fear of Current or Ex-Partner:   . Emotionally Abused:   Marland Kitchen Physically Abused:   . Sexually Abused:     No past surgical history on file.  Family  History  Problem Relation Age of Onset  . Lung cancer Maternal Grandmother   . Stroke Maternal Grandmother   . Asthma Mother     Allergies  Allergen Reactions  . Avocado Other (See Comments)  . Tomato Other (See Comments)  . Tomato Extract Allergy Skin Test     Current Outpatient Medications on File Prior to Visit  Medication Sig Dispense Refill  . JUNEL 1/20 1-20 MG-MCG tablet Take 1 tablet by mouth daily.     No current facility-administered medications on file prior to visit.    BP 108/70   Pulse (!) 103   Temp (!) 96.4 F (35.8 C) (Temporal)   Ht 5' 7.25" (1.708 m)   Wt 110 lb 4 oz (50 kg)   LMP 02/21/2020   SpO2 100%   BMI 17.14 kg/m     Objective:   Physical Exam  Cardiovascular: Normal rate and regular rhythm.  Respiratory: Effort normal and breath sounds normal.  Musculoskeletal:     Cervical back: Neck supple.  Skin: Skin is warm and dry.  Psychiatric: Mood normal.           Assessment & Plan:

## 2020-03-07 ENCOUNTER — Ambulatory Visit: Payer: BC Managed Care – PPO | Admitting: Primary Care

## 2020-03-09 DIAGNOSIS — R1084 Generalized abdominal pain: Secondary | ICD-10-CM | POA: Diagnosis not present

## 2020-03-09 DIAGNOSIS — K5229 Other allergic and dietetic gastroenteritis and colitis: Secondary | ICD-10-CM | POA: Diagnosis not present

## 2020-03-23 ENCOUNTER — Other Ambulatory Visit: Payer: Self-pay | Admitting: Primary Care

## 2020-03-23 DIAGNOSIS — F411 Generalized anxiety disorder: Secondary | ICD-10-CM

## 2020-06-10 DIAGNOSIS — Z130 Encounter for screening for diseases of the blood and blood-forming organs and certain disorders involving the immune mechanism: Secondary | ICD-10-CM | POA: Diagnosis not present

## 2020-06-10 DIAGNOSIS — Z1389 Encounter for screening for other disorder: Secondary | ICD-10-CM | POA: Diagnosis not present

## 2020-06-10 DIAGNOSIS — Z681 Body mass index (BMI) 19 or less, adult: Secondary | ICD-10-CM | POA: Diagnosis not present

## 2020-06-10 DIAGNOSIS — Z01419 Encounter for gynecological examination (general) (routine) without abnormal findings: Secondary | ICD-10-CM | POA: Diagnosis not present

## 2020-06-26 DIAGNOSIS — F411 Generalized anxiety disorder: Secondary | ICD-10-CM

## 2020-06-27 MED ORDER — ESCITALOPRAM OXALATE 10 MG PO TABS: 10.0000 mg | ORAL_TABLET | Freq: Every day | ORAL | 1 refills | Status: DC

## 2020-08-15 ENCOUNTER — Other Ambulatory Visit: Payer: Self-pay | Admitting: Primary Care

## 2020-08-15 DIAGNOSIS — F411 Generalized anxiety disorder: Secondary | ICD-10-CM

## 2020-11-03 ENCOUNTER — Encounter: Payer: BC Managed Care – PPO | Admitting: Primary Care

## 2020-11-04 ENCOUNTER — Ambulatory Visit (INDEPENDENT_AMBULATORY_CARE_PROVIDER_SITE_OTHER): Payer: BC Managed Care – PPO | Admitting: Primary Care

## 2020-11-04 ENCOUNTER — Other Ambulatory Visit: Payer: Self-pay

## 2020-11-04 VITALS — BP 100/62 | HR 94 | Temp 98.4°F | Ht 66.75 in | Wt 121.5 lb

## 2020-11-04 DIAGNOSIS — R109 Unspecified abdominal pain: Secondary | ICD-10-CM | POA: Diagnosis not present

## 2020-11-04 DIAGNOSIS — L309 Dermatitis, unspecified: Secondary | ICD-10-CM | POA: Insufficient documentation

## 2020-11-04 DIAGNOSIS — Z0000 Encounter for general adult medical examination without abnormal findings: Secondary | ICD-10-CM | POA: Diagnosis not present

## 2020-11-04 DIAGNOSIS — F411 Generalized anxiety disorder: Secondary | ICD-10-CM | POA: Diagnosis not present

## 2020-11-04 MED ORDER — ESCITALOPRAM OXALATE 10 MG PO TABS: 10.0000 mg | ORAL_TABLET | Freq: Every day | ORAL | 3 refills | Status: DC

## 2020-11-04 NOTE — Assessment & Plan Note (Signed)
Resolved with Lexapro 10 mg, continue same.

## 2020-11-04 NOTE — Progress Notes (Signed)
Subjective:    Patient ID: Lorraine Wallace, female    DOB: Feb 15, 1996, 25 y.o.   MRN: [id number]  HPI  This visit occurred during the SARS-CoV-2 public health emergency.  Safety protocols were in place, including screening questions prior to the visit, additional usage of staff PPE, and extensive cleaning of exam room while observing appropriate contact time as indicated for disinfecting solutions.   Lorraine Wallace is a 25 year old female who presents today for complete physical.  Immunizations: -Tetanus: 2009 -Influenza: Declines  -Covid-19: Declines   Diet: She endorses a fair diet.  Exercise: She is not exercising   Eye exam: No recent exam Dental exam: Completes semi-annually   Pap Smear: Completed 2 years ago, [facility]  BP Readings from Last 3 Encounters:  11/04/20 100/62  03/02/20 108/70  02/12/20 100/66     Review of Systems  Constitutional: Negative for unexpected weight change.  HENT: Negative for rhinorrhea.   Eyes: Negative for visual disturbance.  Respiratory: Negative for cough and shortness of breath.   Cardiovascular: Negative for chest pain.  Gastrointestinal: Negative for constipation and diarrhea.  Genitourinary: Negative for difficulty urinating.  Musculoskeletal: Negative for arthralgias.  Skin: Negative for rash.  Allergic/Immunologic: Negative for environmental allergies.  Neurological: Negative for dizziness, numbness and headaches.  Psychiatric/Behavioral: The patient is not nervous/anxious.        Past Medical History:  Diagnosis Date  . Chicken pox   . Urinary frequency 03/04/2019     Social History   Socioeconomic History  . Marital status: Married    Spouse name: Not on file  . Number of children: Not on file  . Years of education: Not on file  . Highest education level: Not on file  Occupational History  . Not on file  Tobacco Use  . Smoking status: Never Smoker  . Smokeless tobacco: Never Used  Substance and  Sexual Activity  . Alcohol use: Yes    Alcohol/week: 0.0 standard drinks    Comment: social  . Drug use: Not on file  . Sexual activity: Not on file  Other Topics Concern  . Not on file  Social History Narrative   Single.   Highest level of education Associates.   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   Enjoys Diplomatic Services operational officer, shopping, going out to [employer].    Social Determinants of Health   Financial Resource Strain: Not on file  Food Insecurity: Not on file  Transportation Needs: Not on file  Physical Activity: Not on file  Stress: Not on file  Social Connections: Not on file  Intimate Partner Violence: Not on file    No past surgical history on file.  Family History  Problem Relation Age of Onset  . Lung cancer Maternal Grandmother   . Stroke Maternal Grandmother   . Asthma Mother     Allergies  Allergen Reactions  . Avocado Other (See Comments)  . Tomato Other (See Comments)  . Tomato Extract Allergy Skin Test     Current Outpatient Medications on File Prior to Visit  Medication Sig Dispense Refill  . Crisaborole (EUCRISA) 2 % OINT Eucrisa 2 % topical ointment  APPLY 1 A SMALL AMOUNT TO SKIN ONCE A DAY APPLY TO EARS    . escitalopram (LEXAPRO) 10 MG tablet TAKE 1 TABLET (10 MG TOTAL) BY MOUTH DAILY. FOR ANXIETY. 90 tablet 0  . JUNEL 1/20 1-20 MG-MCG tablet Take 1 tablet by mouth daily.     No current facility-administered  medications on file prior to visit.    BP 100/62   Pulse 94   Temp 98.4 F (36.9 C) (Temporal)   Ht 5' 6.75" (1.695 m)   Wt 121 lb 8 oz (55.1 kg)   LMP 08/09/2020 (Within Days) Comment: Pt states that she gets period every 3 months   SpO2 97%   BMI 19.17 kg/m    Objective:   Physical Exam Constitutional:      Appearance: She is well-nourished.  HENT:     Right Ear: Tympanic membrane and ear canal normal.     Left Ear: Tympanic membrane and ear canal normal.     Mouth/Throat:     Mouth: Oropharynx is clear and moist.  Eyes:      Extraocular Movements: EOM normal.     Pupils: Pupils are equal, round, and reactive to light.  Cardiovascular:     Rate and Rhythm: Normal rate and regular rhythm.  Pulmonary:     Effort: Pulmonary effort is normal.     Breath sounds: Normal breath sounds.  Abdominal:     General: Bowel sounds are normal.     Palpations: Abdomen is soft.     Tenderness: There is no abdominal tenderness.  Musculoskeletal:        General: Normal range of motion.     Cervical back: Neck supple.  Skin:    General: Skin is warm and dry.  Neurological:     Mental Status: She is alert and oriented to person, place, and time.     Cranial Nerves: No cranial nerve deficit.     Deep Tendon Reflexes:     Reflex Scores:      Patellar reflexes are 2+ on the right side and 2+ on the left side. Psychiatric:        Mood and Affect: Mood and affect and mood normal.            Assessment & Plan:

## 2020-11-04 NOTE — Assessment & Plan Note (Signed)
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  Follows with dermatology.

## 2020-11-04 NOTE — Assessment & Plan Note (Signed)
Doing much better on Lexapro 10 mg, would like to continue. Continue Lexapro 10 mg.

## 2020-11-04 NOTE — Assessment & Plan Note (Signed)
Tetanus due, she declines today but will set up a nurse visit to have this done.  Pap smear up-to-date per patient, follows with [facility].  We will obtain his records  Encouraged healthy diet, regular exercise  Exam today stable. Follow-up in 1 year.

## 2020-11-04 NOTE — Patient Instructions (Signed)
Work on a healthy diet.  Ensure you are consuming 64 ounces of water daily.  Be sure to get regular exercise.  Schedule nurse visit to complete your tetanus shot.  It was a pleasure to see you today!   Preventive Care 55-27 Years Old, Female Preventive care refers to lifestyle choices and visits with your health care provider that can promote health and wellness. This includes:  A yearly physical exam. This is also called an annual wellness visit.  Regular dental and eye exams.  Immunizations.  Screening for certain conditions.  Healthy lifestyle choices, such as: ? Eating a healthy diet. ? Getting regular exercise. ? Not using drugs or products that contain nicotine and tobacco. ? Limiting alcohol use. What can I expect for my preventive care visit? Physical exam Your health care provider may check your:  Height and weight. These may be used to calculate your BMI (body mass index). BMI is a measurement that tells if you are at a healthy weight.  Heart rate and blood pressure.  Body temperature.  Skin for abnormal spots. Counseling Your health care provider may ask you questions about your:  Past medical problems.  Family's medical history.  Alcohol, tobacco, and drug use.  Emotional well-being.  Home life and relationship well-being.  Sexual activity.  Diet, exercise, and sleep habits.  Work and work Statistician.  Access to firearms.  Method of birth control.  Menstrual cycle.  Pregnancy history. What immunizations do I need? Vaccines are usually given at various ages, according to a schedule. Your health care provider will recommend vaccines for you based on your age, medical history, and lifestyle or other factors, such as travel or where you work.   What tests do I need? Blood tests  Lipid and cholesterol levels. These may be checked every 5 years starting at age 62.  Hepatitis C test.  Hepatitis B test. Screening  Diabetes screening.  This is done by checking your blood sugar (glucose) after you have not eaten for a while (fasting).  STD (sexually transmitted disease) testing, if you are at risk.  BRCA-related cancer screening. This may be done if you have a family history of breast, ovarian, tubal, or peritoneal cancers.  Pelvic exam and Pap test. This may be done every 3 years starting at age 52. Starting at age 38, this may be done every 5 years if you have a Pap test in combination with an HPV test. Talk with your health care provider about your test results, treatment options, and if necessary, the need for more tests.   Follow these instructions at home: Eating and drinking  Eat a healthy diet that includes fresh fruits and vegetables, whole grains, lean protein, and low-fat dairy products.  Take vitamin and mineral supplements as recommended by your health care provider.  Do not drink alcohol if: ? Your health care provider tells you not to drink. ? You are pregnant, may be pregnant, or are planning to become pregnant.  If you drink alcohol: ? Limit how much you have to 0-1 drink a day. ? Be aware of how much alcohol is in your drink. In the [place] one drink equals one 12 oz bottle of beer (355 mL), one 5 oz glass of wine (148 mL), or one 1 oz glass of hard liquor (44 mL).   Lifestyle  Take daily care of your teeth and gums. Brush your teeth every morning and night with fluoride toothpaste. Floss one time each day.  Stay active. Exercise  for at least 30 minutes 5 or more days each week.  Do not use any products that contain nicotine or tobacco, such as cigarettes, e-cigarettes, and chewing tobacco. If you need help quitting, ask your health care provider.  Do not use drugs.  If you are sexually active, practice safe sex. Use a condom or other form of protection to prevent STIs (sexually transmitted infections).  If you do not wish to become pregnant, use a form of birth control. If you plan to become  pregnant, see your health care provider for a prepregnancy visit.  Find healthy ways to cope with stress, such as: ? Meditation, yoga, or listening to music. ? Journaling. ? Talking to a trusted person. ? Spending time with friends and family. Safety  Always wear your seat belt while driving or riding in a vehicle.  Do not drive: ? If you have been drinking alcohol. Do not ride with someone who has been drinking. ? When you are tired or distracted. ? While texting.  Wear a helmet and other protective equipment during sports activities.  If you have firearms in your house, make sure you follow all gun safety procedures.  Seek help if you have been physically or sexually abused. What's next?  Go to your health care provider once a year for an annual wellness visit.  Ask your health care provider how often you should have your eyes and teeth checked.  Stay up to date on all vaccines. This information is not intended to replace advice given to you by your health care provider. Make sure you discuss any questions you have with your health care provider. Document Revised: 04/24/2020 Document Reviewed: 05/08/2018 Elsevier Patient Education  2021 [employer].

## 2021-04-05 ENCOUNTER — Telehealth: Payer: Self-pay | Admitting: Primary Care

## 2021-04-05 NOTE — Telephone Encounter (Signed)
Noted, will review my chart message when able.

## 2021-04-05 NOTE — Telephone Encounter (Signed)
Left a message on voicemail for patient to call the office back. 

## 2021-04-05 NOTE — Telephone Encounter (Signed)
Please advise 

## 2021-04-05 NOTE — Telephone Encounter (Signed)
Patient called the office back. Patient stated that she is pretty sure that she had food poisoning. Patient stated that she scheduled the appointment in case she did not continue to improve. Patient stated that she is feeling better today. Patient stated that she is able to keep food in her system and had a regular bowel movement today. Patient stated that she has noticed some bright red blood in her stool today. Patient stated that she thinks the blood is from all of the inflammation that she has had. Patient stated that there is also some mucus in her BM. Patient stated the cramping has gotten better and she does not have as much gas as she was having. Patient stated that she has been eating rice and bread. Patient was given ER precautions and she verbalized understanding. Patient stated that she is on mychart and Mayra Reel NP can message her thru [facility].

## 2021-04-05 NOTE — Telephone Encounter (Signed)
Pt put in appointment request via mychart for following. Please triage.  From: Lorraine Wallace  Sent: 04/04/2021  12:00 PM EDT  To: [facility]  Subject: Appointment scheduled from MyChart               Appointment For: Lorraine Wallace ([id number])  Visit Type: OFFICE VISIT (1004)    04/07/2021    9:40 AM  20 mins.  Doreene Nest, NP    [facility]    Patient Comments:  Possible food poisoning/gastroenteritis. Began Saturday night, slowly improving but noticed trace amount of blood in diarrhea and want to be sure I can be seen if needed. Main symptoms are intermittent stomach cramping, diarrhea and gas.

## 2021-04-07 ENCOUNTER — Ambulatory Visit: Payer: BC Managed Care – PPO | Admitting: Primary Care

## 2021-06-15 DIAGNOSIS — E669 Obesity, unspecified: Secondary | ICD-10-CM | POA: Diagnosis not present

## 2021-06-15 DIAGNOSIS — Z130 Encounter for screening for diseases of the blood and blood-forming organs and certain disorders involving the immune mechanism: Secondary | ICD-10-CM | POA: Diagnosis not present

## 2021-06-15 DIAGNOSIS — Z01419 Encounter for gynecological examination (general) (routine) without abnormal findings: Secondary | ICD-10-CM | POA: Diagnosis not present

## 2021-06-15 DIAGNOSIS — Z124 Encounter for screening for malignant neoplasm of cervix: Secondary | ICD-10-CM | POA: Diagnosis not present

## 2021-06-15 LAB — HM PAP SMEAR

## 2021-06-29 DIAGNOSIS — Z20822 Contact with and (suspected) exposure to covid-19: Secondary | ICD-10-CM | POA: Diagnosis not present

## 2021-07-11 ENCOUNTER — Ambulatory Visit: Payer: BC Managed Care – PPO | Admitting: Primary Care

## 2021-08-19 IMAGING — US US ABDOMEN LIMITED
1 series · 14 of 25 positions shown · non-contrast
Comparison: None.

CLINICAL DATA: Nausea, vomiting, diarrhea.

EXAM:
ULTRASOUND ABDOMEN LIMITED RIGHT UPPER QUADRANT

[Series 1: us abdomen limited · 0.22mm/px · 14 of 44 slices shown]
[im 1/44]
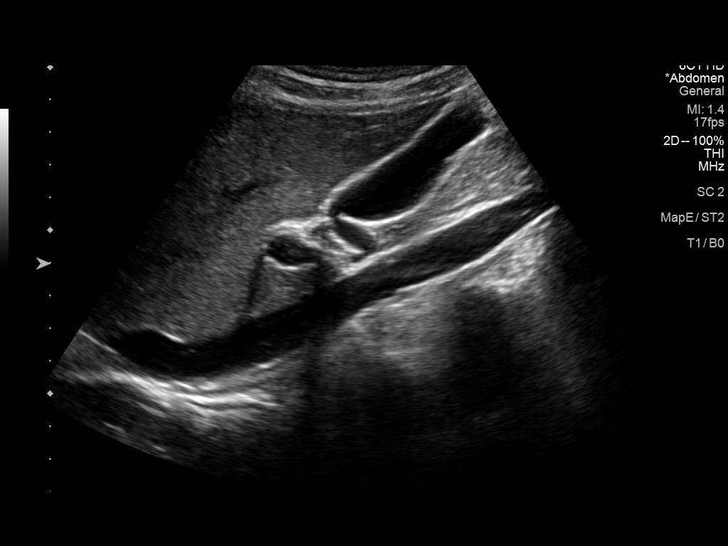
[im 4/44]
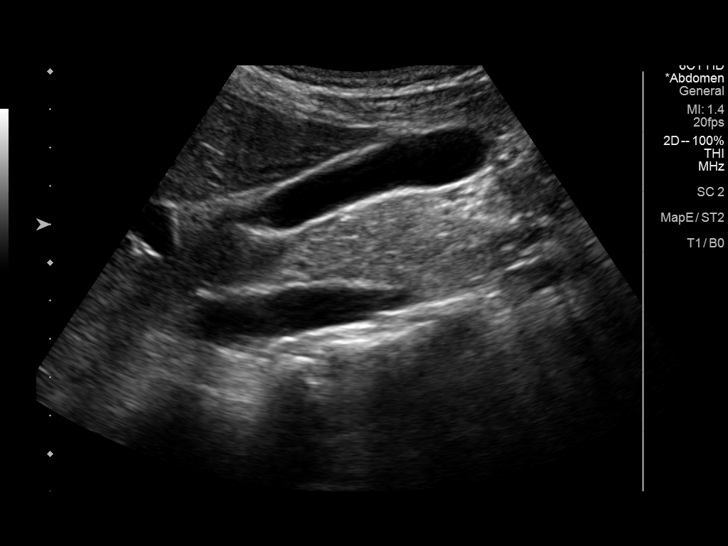
[im 8/44]
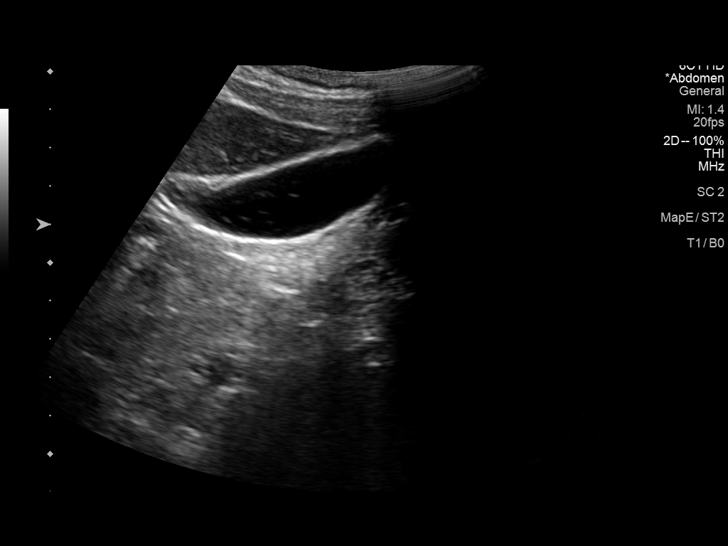
[im 11/44]
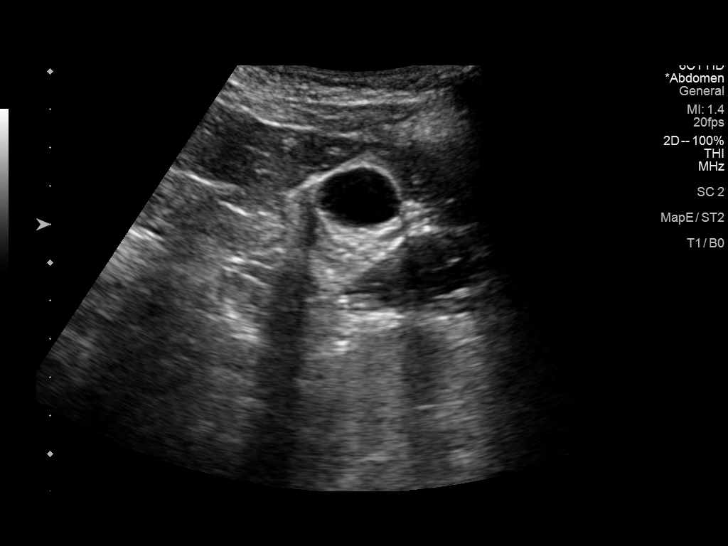
[im 15/44]
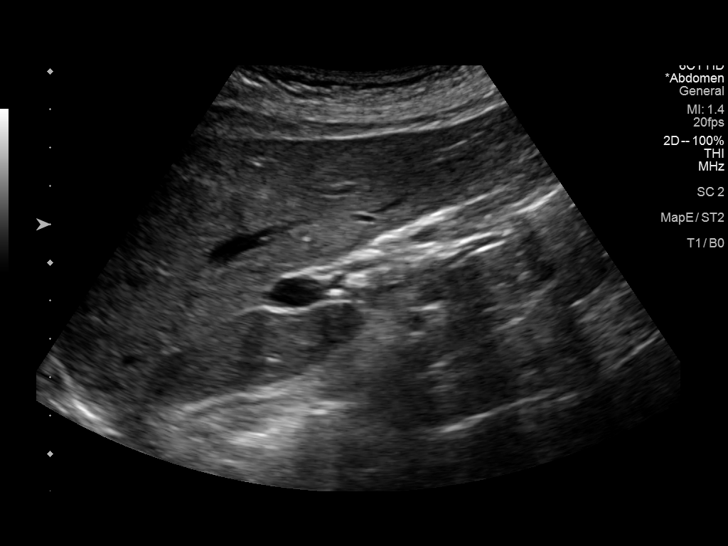
[im 17/44]
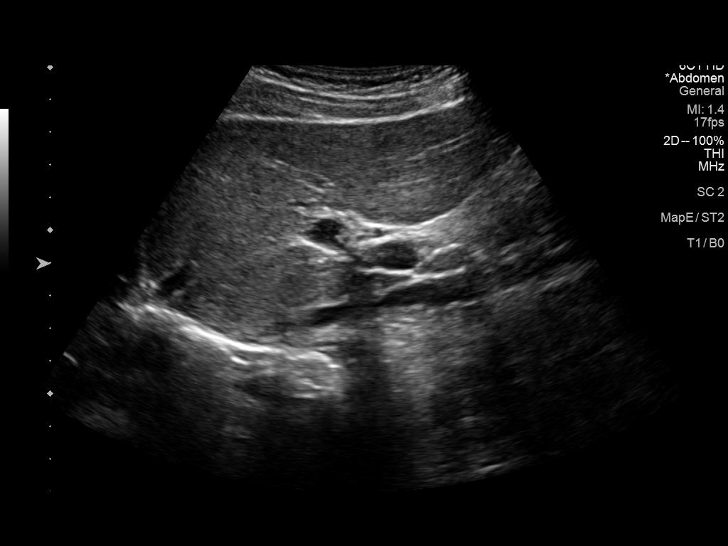
[im 20/44]
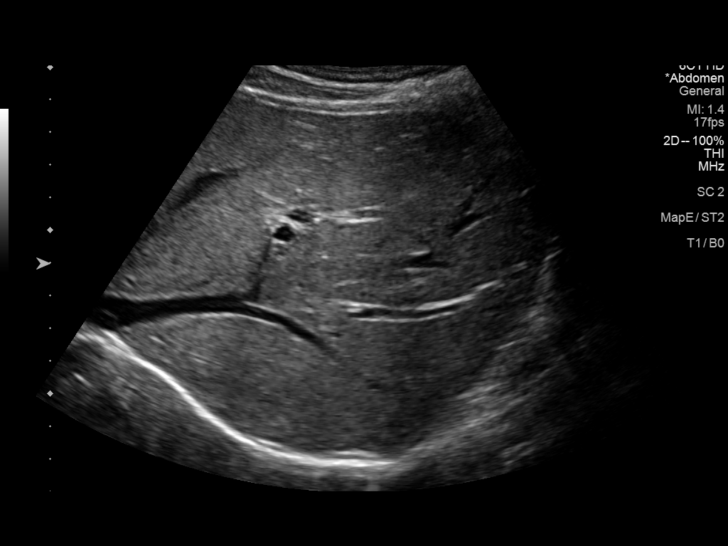
[im 24/44]
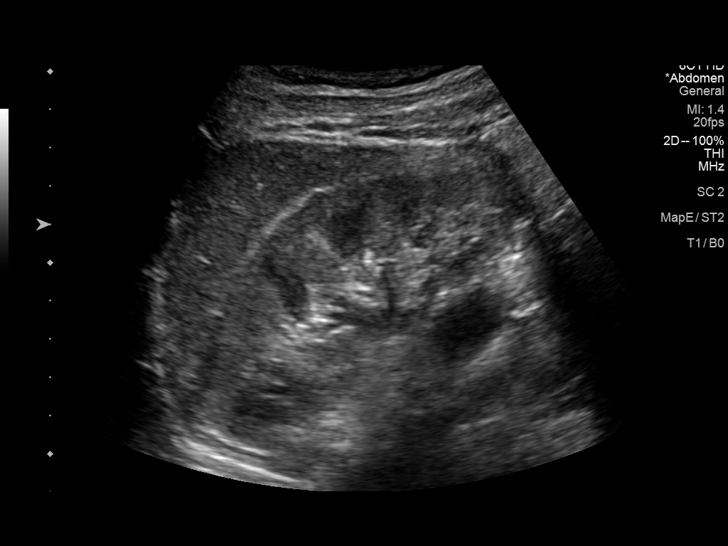
[im 27/44]
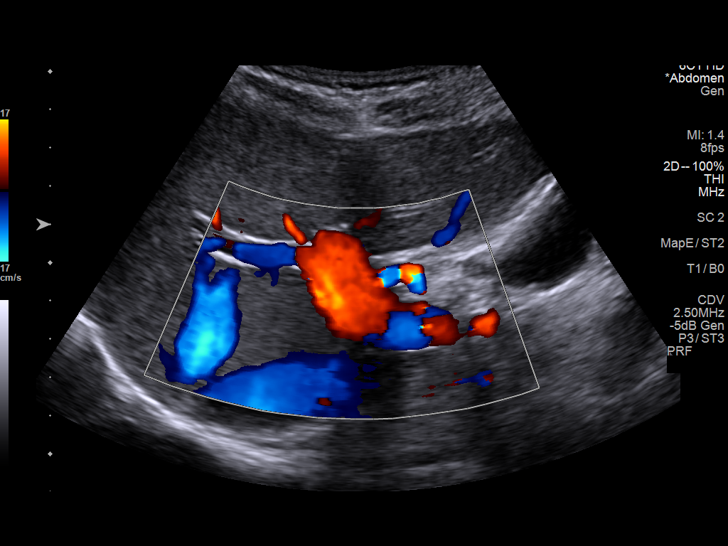
[im 29/44]
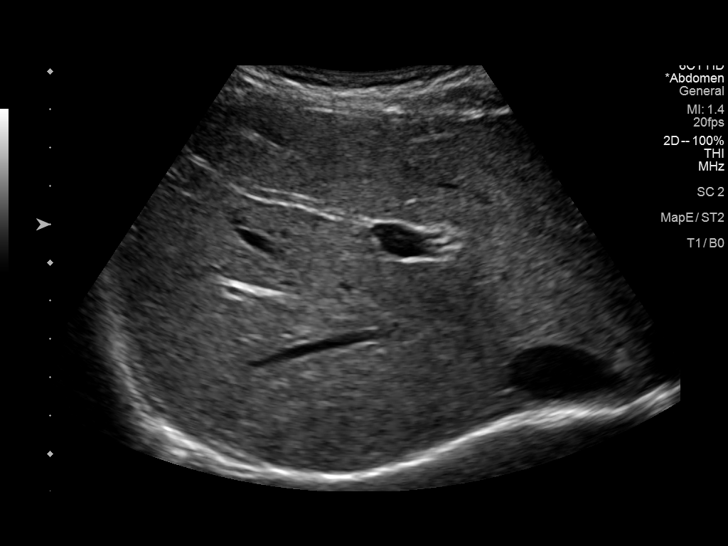
[im 33/44]
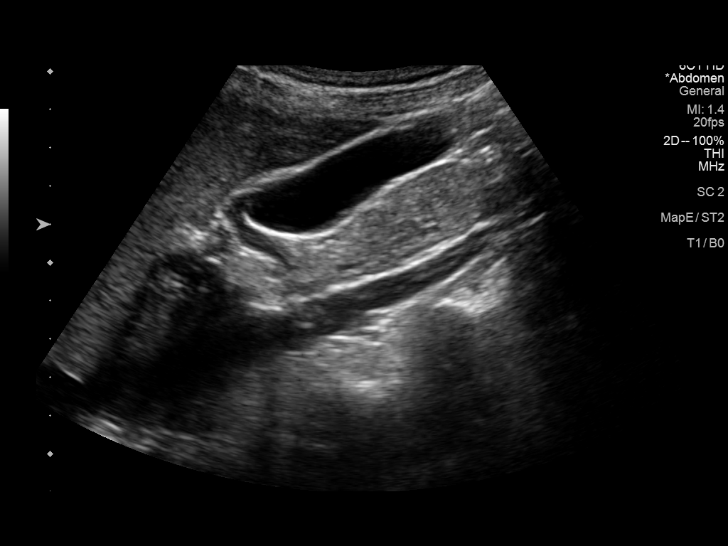
[im 36/44]
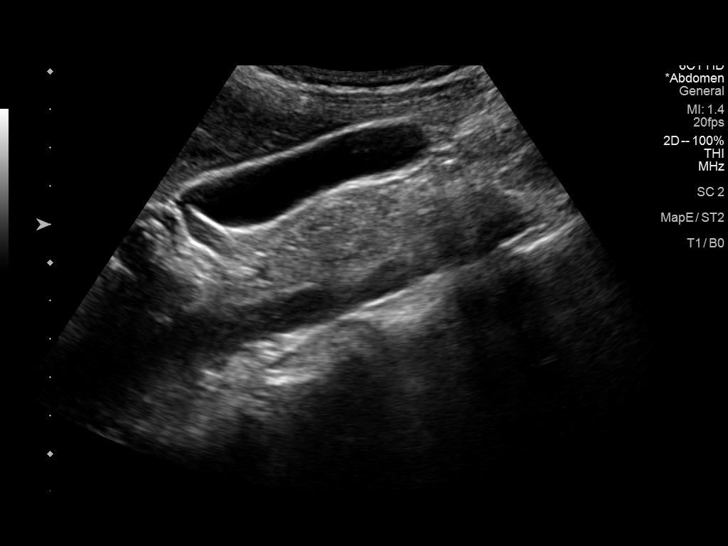
[im 40/44]
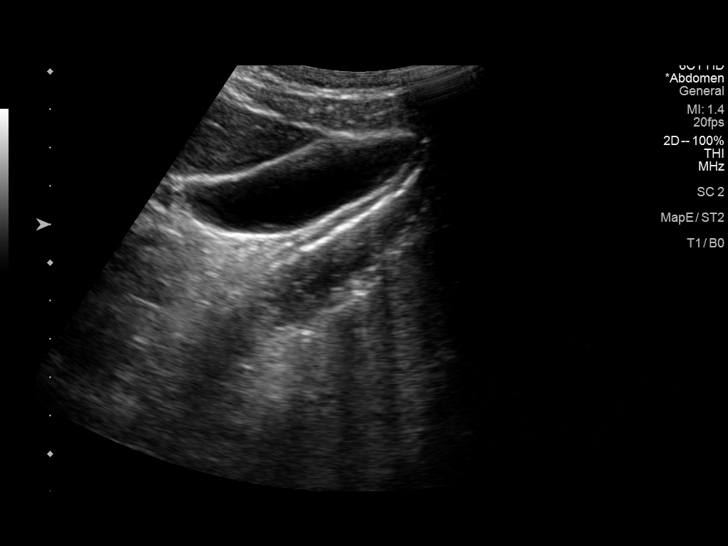
[im 44/44]
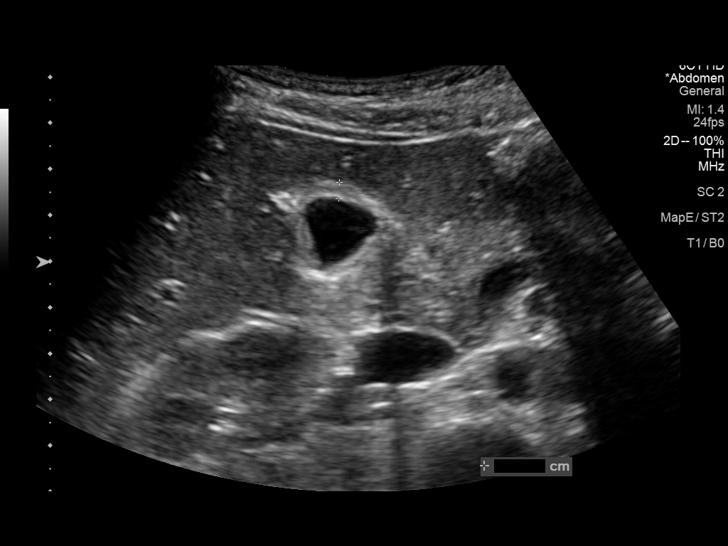

[14 of 25 positions shown; findings below may reference images not displayed]

FINDINGS: Gallbladder:

No gallstones or wall thickening visualized. No sonographic Murphy
sign noted by sonographer. Small amount of sludge is noted within
gallbladder lumen.

Common bile duct:

Diameter: 4 mm which is within normal limits.

Liver:

No focal lesion identified. Within normal limits in parenchymal
echogenicity. Portal vein is patent on color Doppler imaging with
normal direction of blood flow towards the liver.

Other: None.
IMPRESSION: Small amount of gallbladder sludge is noted. No other abnormality
seen in the right upper quadrant of the abdomen.

## 2021-10-29 DIAGNOSIS — Z20822 Contact with and (suspected) exposure to covid-19: Secondary | ICD-10-CM | POA: Diagnosis not present

## 2021-10-31 ENCOUNTER — Ambulatory Visit: Payer: BC Managed Care – PPO | Admitting: Family

## 2021-11-02 ENCOUNTER — Ambulatory Visit: Payer: BC Managed Care – PPO | Admitting: Family

## 2021-11-13 IMAGING — DX DG CHEST 2V
2 series · 2 of 2 positions shown · non-contrast
Comparison: None.

CLINICAL DATA: Chest pressure.

EXAM:
CHEST - 2 VIEW

[chest pa]
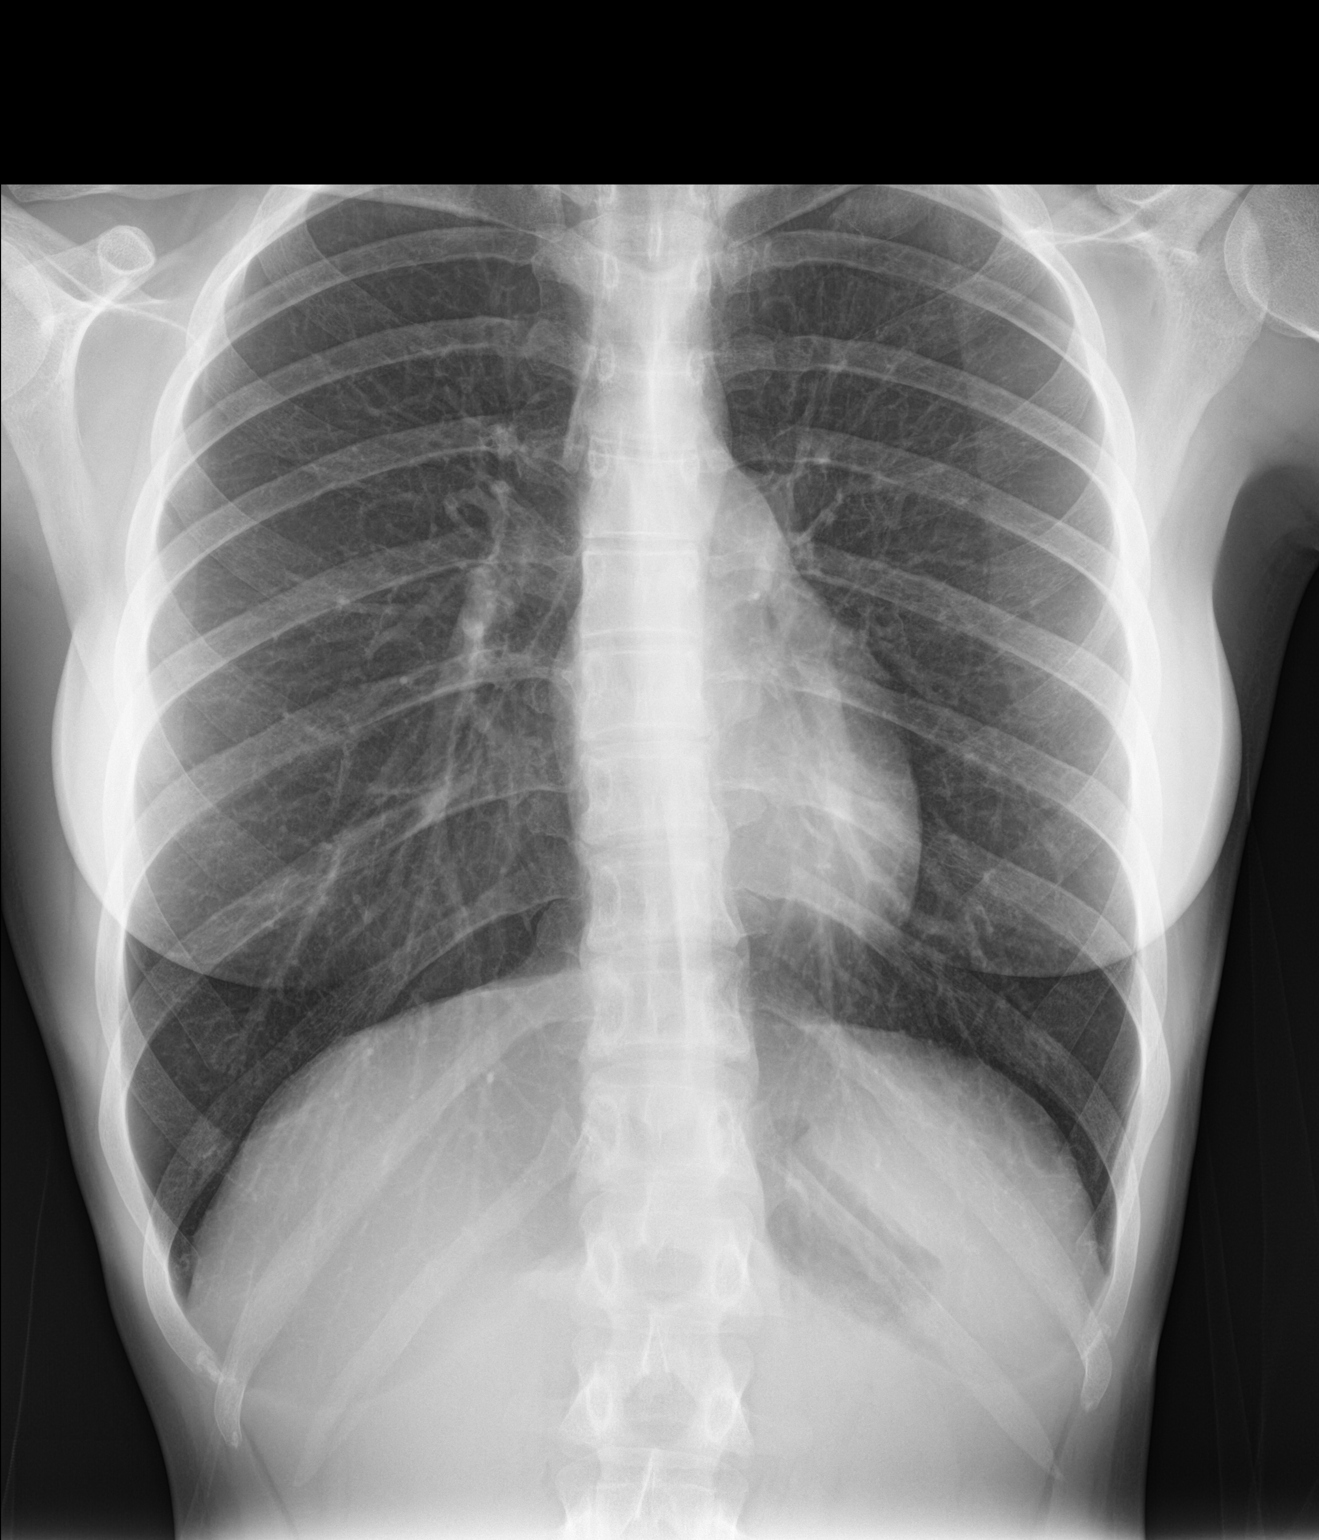

[chest lat]
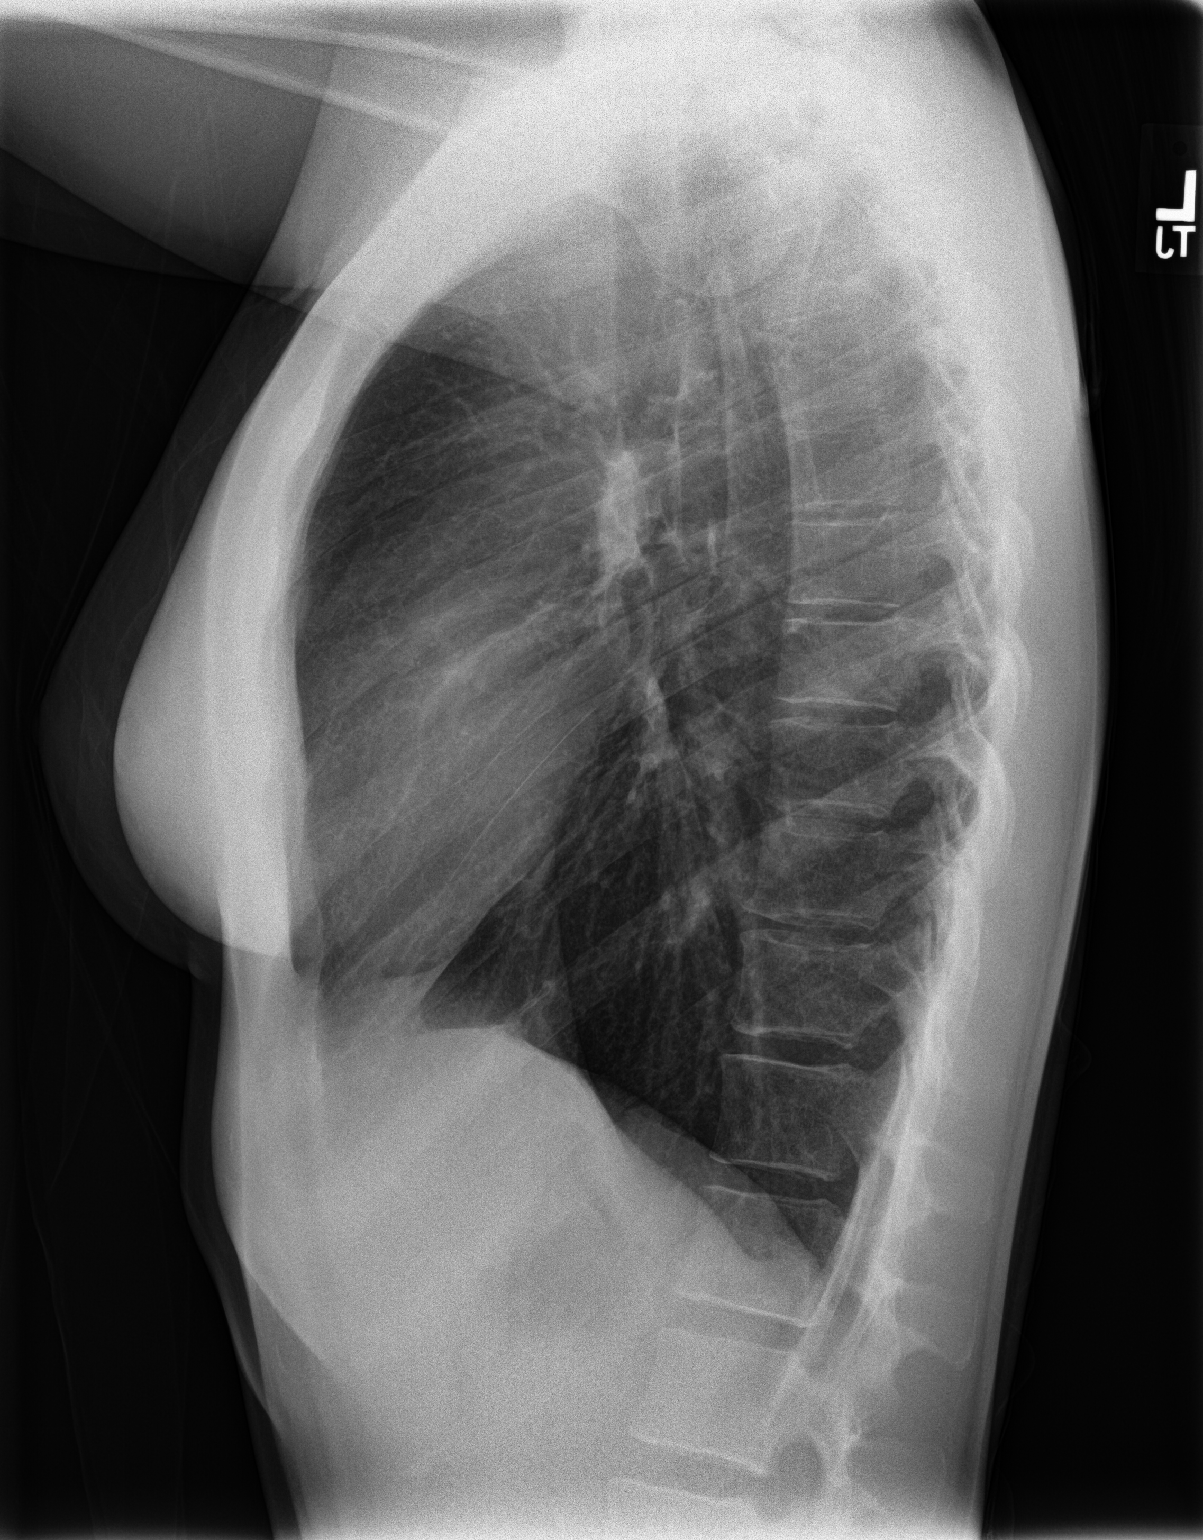

[2 of 2 positions shown; findings below may reference images not displayed]

FINDINGS: The heart size and mediastinal contours are within normal limits.
Both lungs are clear. No evidence of pneumothorax or pleural
effusion. The visualized skeletal structures are unremarkable.
IMPRESSION: Normal study.

## 2021-11-23 ENCOUNTER — Other Ambulatory Visit: Payer: Self-pay | Admitting: Primary Care

## 2021-11-30 ENCOUNTER — Telehealth: Payer: BC Managed Care – PPO | Admitting: Emergency Medicine

## 2021-11-30 DIAGNOSIS — J019 Acute sinusitis, unspecified: Secondary | ICD-10-CM

## 2021-11-30 MED ORDER — AMOXICILLIN-POT CLAVULANATE 875-125 MG PO TABS: 1.0000 | ORAL_TABLET | Freq: Two times a day (BID) | ORAL | 0 refills | Status: DC

## 2021-11-30 NOTE — Progress Notes (Signed)
?Virtual Visit Consent  ? ?Sharalyn Ink, you are scheduled for a virtual visit with a [facility] provider today.   ?  ?Just as with appointments in the office, your consent must be obtained to participate.  Your consent will be active for this visit and any virtual visit you may have with one of our providers in the next 365 days.   ?  ?If you have a MyChart account, a copy of this consent can be sent to you electronically.  All virtual visits are billed to your insurance company just like a traditional visit in the office.   ? ?As this is a virtual visit, video technology does not allow for your provider to perform a traditional examination.  This may limit your provider's ability to fully assess your condition.  If your provider identifies any concerns that need to be evaluated in person or the need to arrange testing (such as labs, EKG, etc.), we will make arrangements to do so.   ?  ?Although advances in technology are sophisticated, we cannot ensure that it will always work on either your end or our end.  If the connection with a video visit is poor, the visit may have to be switched to a telephone visit.  With either a video or telephone visit, we are not always able to ensure that we have a secure connection.    ? ?I need to obtain your verbal consent now.   Are you willing to proceed with your visit today?  ?  ?MAIYAH LUBAS has provided verbal consent on 11/30/2021 for a virtual visit (video or telephone). ?  ?Roxy Horseman, PA-C  ? ?Date: 11/30/2021 9:59 AM ? ? ?Virtual Visit via Video Note  ? ?Merwyn Katos, connected with  ZABELLE LOISEL  ([id number], 1996-08-04) on 11/30/21 at 10:00 AM EDT by a video-enabled telemedicine application and verified that I am speaking with the correct person using two identifiers. ? ?Location: ?Patient: Virtual Visit Location Patient: Home ?Provider: Virtual Visit Location Provider: Home Office ?  ?I discussed the limitations of evaluation and  management by telemedicine and the availability of in person appointments. The patient expressed understanding and agreed to proceed.   ? ?History of Present Illness: ?Lorraine Wallace is a 26 y.o. who identifies as a female who was assigned female at birth, and is being seen today for sinus congestion.  States that on Sunday she started with a sore throat.  Reports that it has progressively worsened for the past 5 days.  Has tried OTC meds without much relief.  Denies fever.  States that she is not pregnant. ? ?HPI: HPI  ?Problems:  ?Patient Active Problem List  ? Diagnosis Date Noted  ? Eczema 11/04/2020  ? Preventative health care 11/04/2020  ? Vaginal discharge 02/12/2020  ? GAD (generalized anxiety disorder) 01/25/2020  ? Chest pressure 10/16/2019  ? Vaginitis 09/08/2019  ? Abdominal cramping 07/13/2019  ? Nausea vomiting and diarrhea 07/13/2019  ?  ?Allergies:  ?Allergies  ?Allergen Reactions  ? Avocado Other (See Comments)  ? Tomato Other (See Comments)  ? Tomato Extract Allergy Skin Test   ? ?Medications:  ?Current Outpatient Medications:  ?  Crisaborole (EUCRISA) 2 % OINT, Eucrisa 2 % topical ointment  APPLY 1 A SMALL AMOUNT TO SKIN ONCE A DAY APPLY TO EARS, Disp: , Rfl:  ?  escitalopram (LEXAPRO) 10 MG tablet, Take 1 tablet (10 mg total) by mouth daily. For anxiety. Office visit required for further  refills., Disp: 30 tablet, Rfl: 0 ?  JUNEL 1/20 1-20 MG-MCG tablet, Take 1 tablet by mouth daily., Disp: , Rfl:  ? ?Observations/Objective: ?Patient is well-developed, well-nourished in no acute distress.  ?Resting comfortably  at home.  ?Head is normocephalic, atraumatic.  ?No labored breathing.  ?Speech is clear and coherent with logical content.  ?Patient is alert and oriented at baseline.  ?Sounds congested. ? ?Assessment and Plan: ?1. Acute sinusitis, recurrence not specified, unspecified location ? ?- Continue OTC meds and nasal saline for 2 more days.  If not improving, then begin Augmentin as  prescribed. ? ?Follow Up Instructions: ?I discussed the assessment and treatment plan with the patient. The patient was provided an opportunity to ask questions and all were answered. The patient agreed with the plan and demonstrated an understanding of the instructions.  A copy of instructions were sent to the patient via MyChart unless otherwise noted below.  ? ? ? ?The patient was advised to call back or seek an in-person evaluation if the symptoms worsen or if the condition fails to improve as anticipated. ? ?Time:  ?I spent 11 minutes with the patient via telehealth technology discussing the above problems/concerns.   ? ?Montine Circle, PA-C ? ?

## 2021-12-20 ENCOUNTER — Other Ambulatory Visit: Payer: Self-pay | Admitting: Primary Care

## 2021-12-20 DIAGNOSIS — F411 Generalized anxiety disorder: Secondary | ICD-10-CM

## 2021-12-28 ENCOUNTER — Ambulatory Visit (INDEPENDENT_AMBULATORY_CARE_PROVIDER_SITE_OTHER): Payer: BC Managed Care – PPO | Admitting: Primary Care

## 2021-12-28 VITALS — BP 98/62 | HR 89 | Temp 99.2°F | Ht 66.75 in | Wt 131.0 lb

## 2021-12-28 DIAGNOSIS — F411 Generalized anxiety disorder: Secondary | ICD-10-CM

## 2021-12-28 DIAGNOSIS — Z0000 Encounter for general adult medical examination without abnormal findings: Secondary | ICD-10-CM

## 2021-12-28 MED ORDER — ESCITALOPRAM OXALATE 10 MG PO TABS: 10.0000 mg | ORAL_TABLET | Freq: Every day | ORAL | 3 refills | Status: DC

## 2021-12-28 NOTE — Assessment & Plan Note (Signed)
Controlled. ? ?Continue Lexapro 10 mg daily. ?Following with therapy.  ? ?

## 2021-12-28 NOTE — Assessment & Plan Note (Signed)
Tetanus due, patient declines today. ? ?Encouraged healthy diet, regular exercise. ? ?Exam today stable. ?

## 2021-12-28 NOTE — Patient Instructions (Signed)
It was a pleasure to see you today!  Preventive Care 21-28 Years Old, Female Preventive care refers to lifestyle choices and visits with your health care provider that can promote health and wellness. Preventive care visits are also called wellness exams. What can I expect for my preventive care visit? Counseling During your preventive care visit, your health care provider may ask about your: Medical history, including: Past medical problems. Family medical history. Pregnancy history. Current health, including: Menstrual cycle. Method of birth control. Emotional well-being. Home life and relationship well-being. Sexual activity and sexual health. Lifestyle, including: Alcohol, nicotine or tobacco, and drug use. Access to firearms. Diet, exercise, and sleep habits. Work and work environment. Sunscreen use. Safety issues such as seatbelt and bike helmet use. Physical exam Your health care provider may check your: Height and weight. These may be used to calculate your BMI (body mass index). BMI is a measurement that tells if you are at a healthy weight. Waist circumference. This measures the distance around your waistline. This measurement also tells if you are at a healthy weight and may help predict your risk of certain diseases, such as type 2 diabetes and high blood pressure. Heart rate and blood pressure. Body temperature. Skin for abnormal spots. What immunizations do I need?  Vaccines are usually given at various ages, according to a schedule. Your health care provider will recommend vaccines for you based on your age, medical history, and lifestyle or other factors, such as travel or where you work. What tests do I need? Screening Your health care provider may recommend screening tests for certain conditions. This may include: Pelvic exam and Pap test. Lipid and cholesterol levels. Diabetes screening. This is done by checking your blood sugar (glucose) after you have not  eaten for a while (fasting). Hepatitis B test. Hepatitis C test. HIV (human immunodeficiency virus) test. STI (sexually transmitted infection) testing, if you are at risk. BRCA-related cancer screening. This may be done if you have a family history of breast, ovarian, tubal, or peritoneal cancers. Talk with your health care provider about your test results, treatment options, and if necessary, the need for more tests. Follow these instructions at home: Eating and drinking  Eat a healthy diet that includes fresh fruits and vegetables, whole grains, lean protein, and low-fat dairy products. Take vitamin and mineral supplements as recommended by your health care provider. Do not drink alcohol if: Your health care provider tells you not to drink. You are pregnant, may be pregnant, or are planning to become pregnant. If you drink alcohol: Limit how much you have to 0-1 drink a day. Know how much alcohol is in your drink. In the [place] one drink equals one 12 oz bottle of beer (355 mL), one 5 oz glass of wine (148 mL), or one 1 oz glass of hard liquor (44 mL). Lifestyle Brush your teeth every morning and night with fluoride toothpaste. Floss one time each day. Exercise for at least 30 minutes 5 or more days each week. Do not use any products that contain nicotine or tobacco. These products include cigarettes, chewing tobacco, and vaping devices, such as e-cigarettes. If you need help quitting, ask your health care provider. Do not use drugs. If you are sexually active, practice safe sex. Use a condom or other form of protection to prevent STIs. If you do not wish to become pregnant, use a form of birth control. If you plan to become pregnant, see your health care provider for a prepregnancy visit.   Find healthy ways to manage stress, such as: Meditation, yoga, or listening to music. Journaling. Talking to a trusted person. Spending time with friends and family. Minimize exposure to UV  radiation to reduce your risk of skin cancer. Safety Always wear your seat belt while driving or riding in a vehicle. Do not drive: If you have been drinking alcohol. Do not ride with someone who has been drinking. If you have been using any mind-altering substances or drugs. While texting. When you are tired or distracted. Wear a helmet and other protective equipment during sports activities. If you have firearms in your house, make sure you follow all gun safety procedures. Seek help if you have been physically or sexually abused. What's next? Go to your health care provider once a year for an annual wellness visit. Ask your health care provider how often you should have your eyes and teeth checked. Stay up to date on all vaccines. This information is not intended to replace advice given to you by your health care provider. Make sure you discuss any questions you have with your health care provider. Document Revised: 02/22/2021 Document Reviewed: 02/22/2021 Elsevier Patient Education  2023 [employer] Inc[employer]  

## 2021-12-28 NOTE — Progress Notes (Signed)
? ?Subjective:  ? ? Patient ID: Lorraine Wallace, female    DOB: 1996-05-26, 26 y.o.   MRN: [id number] ? ?HPI ? ?Lorraine Wallace is a very pleasant 26 y.o. female who presents today for complete physical and follow up of chronic conditions. ? ?She would also like to mention symptoms for which she thinks may be adult onset ADHD. Symptoms include binge eating, difficulty focusing, trouble completing tasks, interrupting people. She's been speaking with her therapist about these symptoms who suspected ADHD. No prior diagnosis or formal testing.  ? ?Immunizations: ?-Tetanus: Unknown. Declines  ?-Influenza: Did not complete last season ?-Covid-19: Has not completed  ? ?Diet: Fair diet.  ?Exercise: Active daily. Walking.  ? ?Eye exam: Completed years ago. No concerns.  ?Dental exam: Completes semi-annually  ? ?Pap Smear: Completed per GYN, up-to-date per patient. ? ? ?BP Readings from Last 3 Encounters:  ?12/28/21 98/62  ?11/04/20 100/62  ?03/02/20 108/70  ? ? ? ? ?Review of Systems  ?Constitutional:  Negative for unexpected weight change.  ?HENT:  Negative for rhinorrhea.   ?Eyes:  Negative for visual disturbance.  ?Respiratory:  Negative for cough and shortness of breath.   ?Cardiovascular:  Negative for chest pain.  ?Gastrointestinal:  Negative for constipation and diarrhea.  ?Genitourinary:  Negative for difficulty urinating and menstrual problem.  ?Musculoskeletal:  Negative for arthralgias and myalgias.  ?Skin:  Negative for rash.  ?Allergic/Immunologic: Negative for environmental allergies.  ?Neurological:  Negative for dizziness and headaches.  ?Psychiatric/Behavioral:  The patient is not nervous/anxious.   ? ?   ? ? ?Past Medical History:  ?Diagnosis Date  ? Chicken pox   ? Urinary frequency 03/04/2019  ? ? ?Social History  ? ?Socioeconomic History  ? Marital status: Married  ?  Spouse name: Not on file  ? Number of children: Not on file  ? Years of education: Not on file  ? Highest education level: Not on  file  ?Occupational History  ? Not on file  ?Tobacco Use  ? Smoking status: Never  ? Smokeless tobacco: Never  ?Substance and Sexual Activity  ? Alcohol use: Yes  ?  Alcohol/week: 0.0 standard drinks  ?  Comment: social  ? Drug use: Not on file  ? Sexual activity: Not on file  ?Other Topics Concern  ? Not on file  ?Social History Narrative  ? Single.  ? Highest level of education Associates.  ? 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  ? Enjoys photography, shopping, going out to restaurants.   ? ?Social Determinants of Health  ? ?Financial Resource Strain: Not on file  ?Food Insecurity: Not on file  ?Transportation Needs: Not on file  ?Physical Activity: Not on file  ?Stress: Not on file  ?Social Connections: Not on file  ?Intimate Partner Violence: Not on file  ? ? ?No past surgical history on file. ? ?Family History  ?Problem Relation Age of Onset  ? Lung cancer Maternal Grandmother   ? Stroke Maternal Grandmother   ? Asthma Mother   ? ? ?Allergies  ?Allergen Reactions  ? Avocado Other (See Comments)  ? Tomato Other (See Comments)  ? Tomato Extract Allergy Skin Test   ? ? ?Current Outpatient Medications on File Prior to Visit  ?Medication Sig Dispense Refill  ? Crisaborole (EUCRISA) 2 % OINT Eucrisa 2 % topical ointment ? APPLY 1 A SMALL AMOUNT TO SKIN ONCE A DAY APPLY TO EARS    ? JUNEL 1/20 1-20 MG-MCG tablet Take 1 tablet  by mouth daily.    ? ?No current facility-administered medications on file prior to visit.  ? ? ?BP 98/62 (BP Location: Left Arm, Patient Position: Sitting, Cuff Size: Normal)   Pulse 89   Temp 99.2 ?F (37.3 ?C) (Oral)   Ht 5' 6.75" (1.695 m)   Wt 131 lb (59.4 kg)   SpO2 99%   BMI 20.67 kg/m?  ?Objective:  ? Physical Exam ?HENT:  ?   Right Ear: Tympanic membrane and ear canal normal.  ?   Left Ear: Tympanic membrane and ear canal normal.  ?   Nose: Nose normal.  ?Eyes:  ?   Conjunctiva/sclera: Conjunctivae normal.  ?   Pupils: Pupils are equal, round, and reactive to light.  ?Neck:  ?   Thyroid:  No thyromegaly.  ?Cardiovascular:  ?   Rate and Rhythm: Normal rate and regular rhythm.  ?   Heart sounds: No murmur heard. ?Pulmonary:  ?   Effort: Pulmonary effort is normal.  ?   Breath sounds: Normal breath sounds. No rales.  ?Abdominal:  ?   General: Bowel sounds are normal.  ?   Palpations: Abdomen is soft.  ?   Tenderness: There is no abdominal tenderness.  ?Musculoskeletal:     ?   General: Normal range of motion.  ?   Cervical back: Neck supple.  ?Lymphadenopathy:  ?   Cervical: No cervical adenopathy.  ?Skin: ?   General: Skin is warm and dry.  ?   Findings: No rash.  ?Neurological:  ?   Mental Status: She is alert and oriented to person, place, and time.  ?   Cranial Nerves: No cranial nerve deficit.  ?   Deep Tendon Reflexes: Reflexes are normal and symmetric.  ?Psychiatric:     ?   Mood and Affect: Mood normal.  ? ? ? ? ? ?   ?Assessment & Plan:  ? ? ? ? ?This visit occurred during the SARS-CoV-2 public health emergency.  Safety protocols were in place, including screening questions prior to the visit, additional usage of staff PPE, and extensive cleaning of exam room while observing appropriate contact time as indicated for disinfecting solutions.  ?

## 2022-02-26 IMAGING — US US ABDOMEN LIMITED
1 series · 14 of 25 positions shown · non-contrast
Comparison: Abdominal ultrasound 07/22/2019

CLINICAL DATA: Generalized abdominal pain. Additional history
provided: Abdominal pain in 23-year-old female with generalized
abdominal pain for 1 year, history of gallbladder sludge, please
re-evaluate.

EXAM:
ULTRASOUND ABDOMEN LIMITED RIGHT UPPER QUADRANT

[Series 1: us abdomen limited · 0.11mm/px · 14 of 49 slices shown]
[im 1/49]
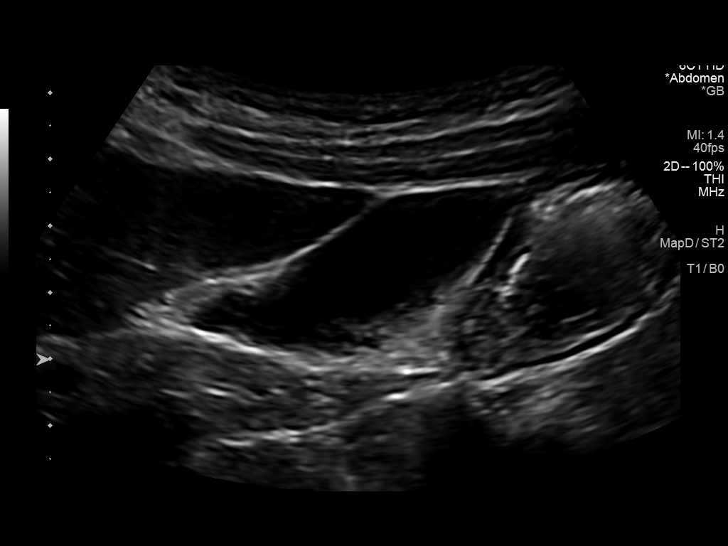
[im 5/49]
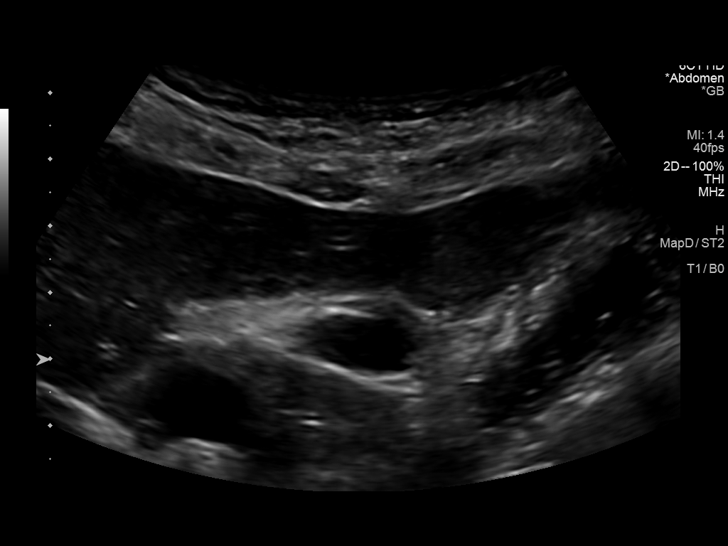
[im 9/49]
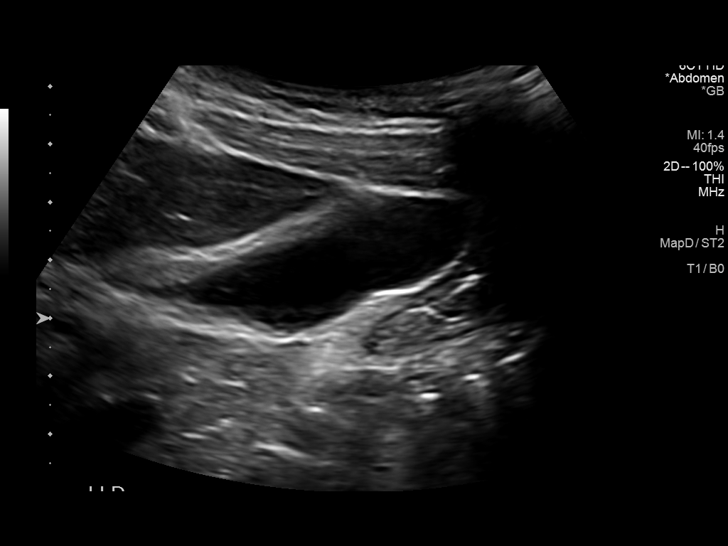
[im 13/49]
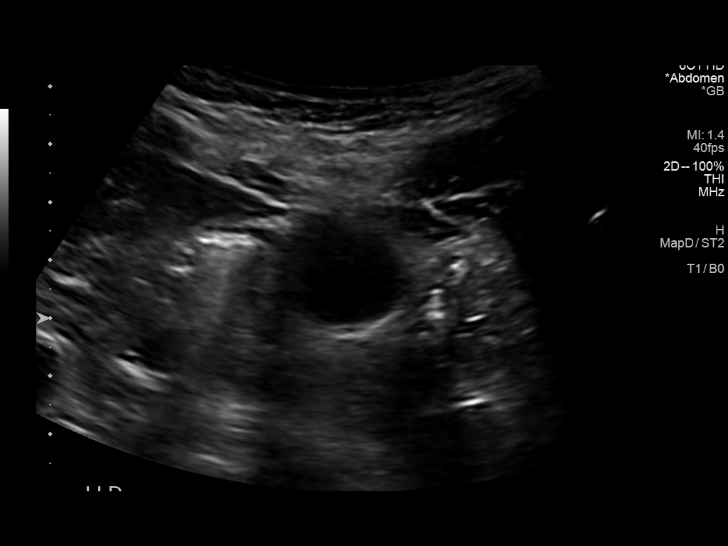
[im 17/49]
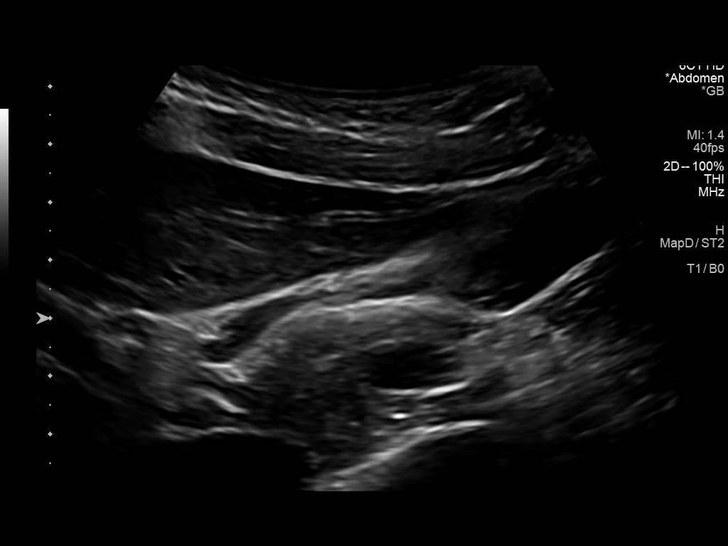
[im 19/49]
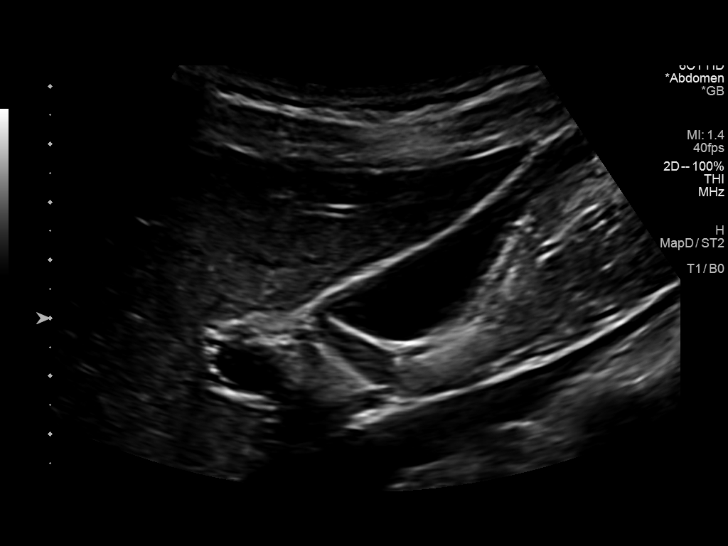
[im 23/49]
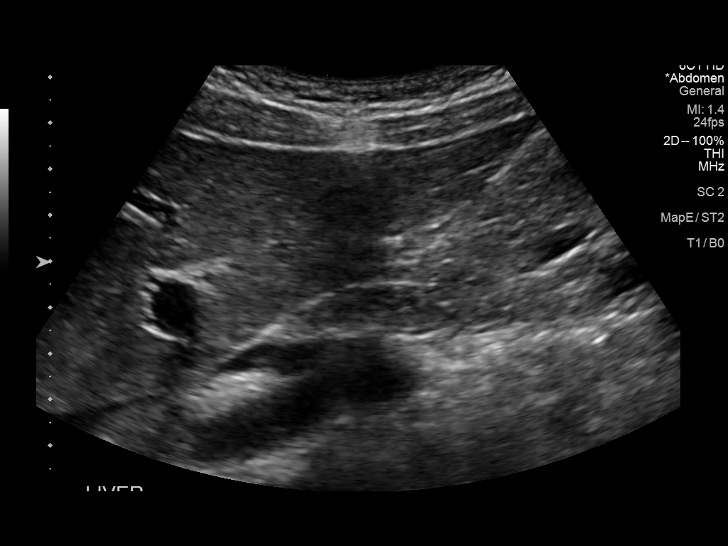
[im 27/49]
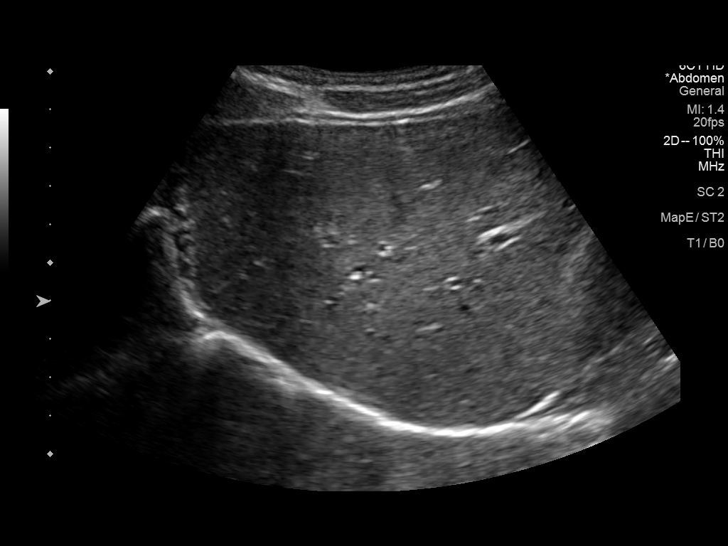
[im 31/49]
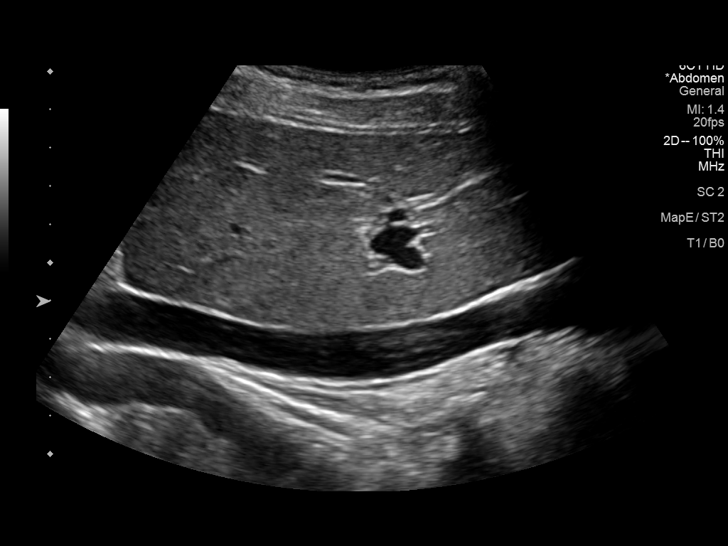
[im 33/49]
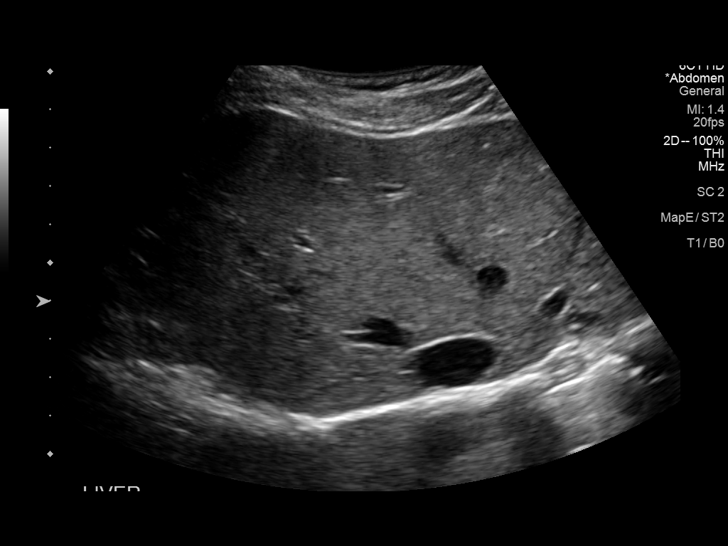
[im 37/49]
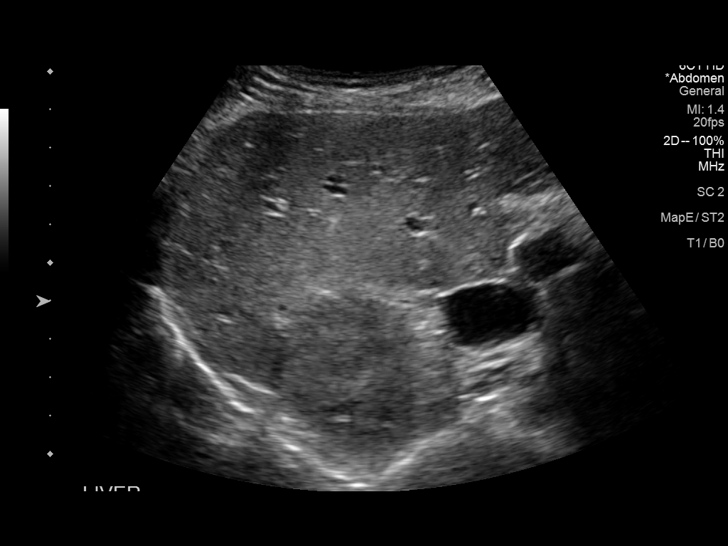
[im 41/49]
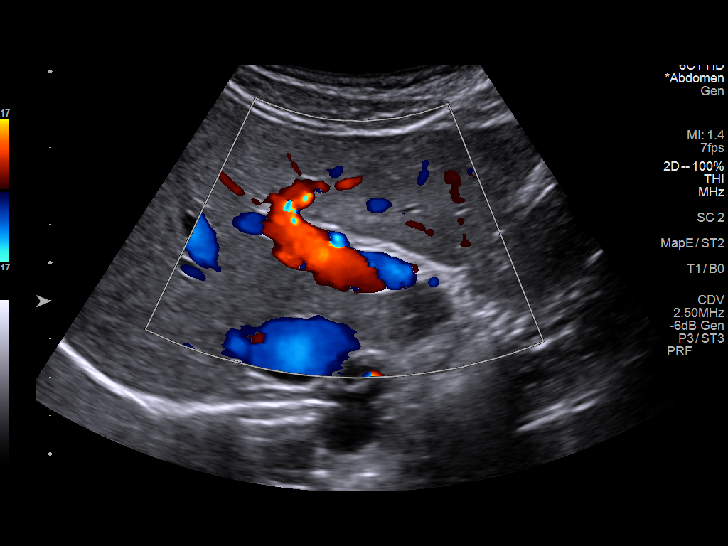
[im 45/49]
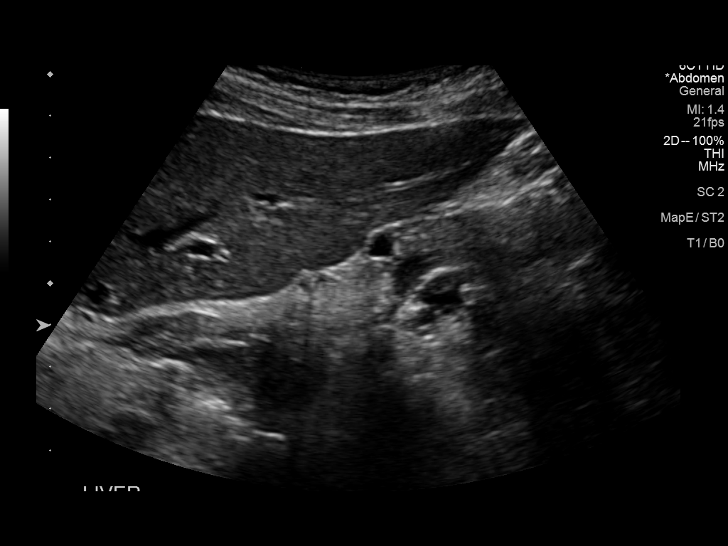
[im 49/49]
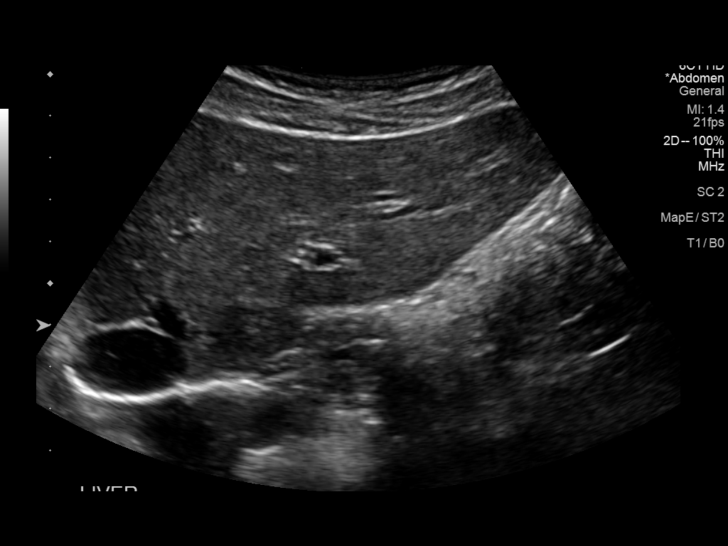

[14 of 25 positions shown; findings below may reference images not displayed]

FINDINGS: Gallbladder:

As before, a small amount of gallbladder sludge is present. No
gallstones. No gallbladder wall thickening visualized. No
sonographic Murphy sign noted by sonographer.

Common bile duct:

Diameter: 3 mm, within normal limits.

Liver:

No focal lesion identified. Within normal limits in parenchymal
echogenicity. Portal vein is patent on color Doppler imaging with
normal direction of blood flow towards the liver.
IMPRESSION: Similar to prior ultrasound 07/22/2019, there is a small amount of
sludge within the gallbladder.

Otherwise unremarkable right upper quadrant ultrasound as described.

## 2022-08-09 DIAGNOSIS — Z202 Contact with and (suspected) exposure to infections with a predominantly sexual mode of transmission: Secondary | ICD-10-CM | POA: Diagnosis not present

## 2022-08-09 DIAGNOSIS — Z01419 Encounter for gynecological examination (general) (routine) without abnormal findings: Secondary | ICD-10-CM | POA: Diagnosis not present

## 2022-08-09 DIAGNOSIS — Z130 Encounter for screening for diseases of the blood and blood-forming organs and certain disorders involving the immune mechanism: Secondary | ICD-10-CM | POA: Diagnosis not present

## 2022-12-30 ENCOUNTER — Other Ambulatory Visit: Payer: Self-pay | Admitting: Primary Care

## 2022-12-30 DIAGNOSIS — F411 Generalized anxiety disorder: Secondary | ICD-10-CM

## 2023-01-01 ENCOUNTER — Encounter: Payer: Self-pay | Admitting: Primary Care

## 2023-01-01 ENCOUNTER — Ambulatory Visit (INDEPENDENT_AMBULATORY_CARE_PROVIDER_SITE_OTHER): Payer: BC Managed Care – PPO | Admitting: Primary Care

## 2023-01-01 VITALS — BP 102/60 | HR 90 | Temp 97.5°F | Ht 66.75 in | Wt 141.0 lb

## 2023-01-01 DIAGNOSIS — F411 Generalized anxiety disorder: Secondary | ICD-10-CM | POA: Diagnosis not present

## 2023-01-01 DIAGNOSIS — Z0000 Encounter for general adult medical examination without abnormal findings: Secondary | ICD-10-CM | POA: Diagnosis not present

## 2023-01-01 LAB — COMPREHENSIVE METABOLIC PANEL
ALT: 16 U/L (ref 0–35)
AST: 15 U/L (ref 0–37)
Albumin: 4.4 g/dL (ref 3.5–5.2)
Alkaline Phosphatase: 72 U/L (ref 39–117)
BUN: 10 mg/dL (ref 6–23)
CO2: 28 mEq/L (ref 19–32)
Calcium: 10.2 mg/dL (ref 8.4–10.5)
Chloride: 102 mEq/L (ref 96–112)
Creatinine, Ser: 0.85 mg/dL (ref 0.40–1.20)
GFR: 92.96 mL/min (ref >60.00)
Glucose, Bld: 82 mg/dL (ref 70–99)
Potassium: 4.5 mEq/L (ref 3.5–5.1)
Sodium: 138 mEq/L (ref 135–145)
Total Bilirubin: 0.5 mg/dL (ref 0.2–1.2)
Total Protein: 7.4 g/dL (ref 6.0–8.3)

## 2023-01-01 LAB — CBC
HCT: 43.8 % (ref 36.0–46.0)
Hemoglobin: 15.5 g/dL — ABNORMAL HIGH (ref 12.0–15.0)
MCHC: 35.4 g/dL (ref 30.0–36.0)
MCV: 93.2 fl (ref 78.0–100.0)
Platelets: 263 10*3/uL (ref 150.0–400.0)
RBC: 4.7 Mil/uL (ref 3.87–5.11)
RDW: 12.3 % (ref 11.5–15.5)
WBC: 7.5 10*3/uL (ref 4.0–10.5)

## 2023-01-01 LAB — LIPID PANEL
Cholesterol: 219 mg/dL — ABNORMAL HIGH (ref 0–200)
HDL: 65.3 mg/dL (ref >39.00)
LDL Cholesterol: 135 mg/dL — ABNORMAL HIGH (ref 0–99)
NonHDL: 154.09
Total CHOL/HDL Ratio: 3
Triglycerides: 94 mg/dL (ref 0.0–149.0)
VLDL: 18.8 mg/dL (ref 0.0–40.0)

## 2023-01-01 LAB — TSH: TSH: 1.17 u[IU]/mL (ref 0.35–5.50)

## 2023-01-01 NOTE — Patient Instructions (Signed)
Stop by the lab prior to leaving today. I will notify you of your results once received.   It was a pleasure to see you today!  

## 2023-01-01 NOTE — Assessment & Plan Note (Signed)
Declines tetanus vaccine.  Pap smear UTD. Follows with GYN.  Discussed the importance of a healthy diet and regular exercise in order for weight loss, and to reduce the risk of further co-morbidity.  Exam stable. Labs pending.  Follow up in 1 year for repeat physical.

## 2023-01-01 NOTE — Assessment & Plan Note (Signed)
Controlled, but will slowly wean off given patient's preference.  Continue weaning on 5 mg of Lexapro daily and then eventually off.  Consider Wellbutrin in the future.  She will update.

## 2023-01-01 NOTE — Progress Notes (Signed)
Subjective:    Patient ID: Lorraine Wallace, female    DOB: [birth year], 27 y.o.   MRN: [id number]  HPI  Lorraine Wallace is a very pleasant 27 y.o. female who presents today for complete physical and follow up of chronic conditions.  She would also like to discuss discontinuing Lexapro due to weight gain. About one week ago she cut her Lexapro dose to 5 mg to wean off. She would like to continue to wean off and see how she does off the medication. She continues to meet with her therapist regularly. She questions if she has ADHD.   Immunizations: -Tetanus: Completed > 10 years ago.  Diet: Fair diet.  Exercise: No regular exercise.  Eye exam: Completed several years ago, no concerns Dental exam: Completes semi-annually    Pap Smear: Completes per GYN  BP Readings from Last 3 Encounters:  01/01/23 102/60  12/28/21 98/62  11/04/20 100/62       Review of Systems  Constitutional:  Negative for unexpected weight change.  HENT:  Negative for rhinorrhea.   Eyes:  Negative for visual disturbance.  Respiratory:  Negative for cough and shortness of breath.   Cardiovascular:  Negative for chest pain.  Gastrointestinal:  Negative for constipation and diarrhea.  Genitourinary:  Negative for difficulty urinating and menstrual problem.  Musculoskeletal:  Negative for arthralgias and myalgias.  Skin:  Negative for rash.  Allergic/Immunologic: Negative for environmental allergies.  Neurological:  Negative for dizziness and headaches.  Psychiatric/Behavioral:  The patient is not nervous/anxious.          Past Medical History:  Diagnosis Date   Chicken pox    Nausea vomiting and diarrhea 07/13/2019   Urinary frequency 03/04/2019    Social History   Socioeconomic History   Marital status: Married    Spouse name: Not on file   Number of children: Not on file   Years of education: Not on file   Highest education level: Not on file  Occupational History   Not on file   Tobacco Use   Smoking status: Never   Smokeless tobacco: Never  Substance and Sexual Activity   Alcohol use: Yes    Alcohol/week: 0.0 standard drinks of alcohol    Comment: social   Drug use: Not on file   Sexual activity: Not on file  Other Topics Concern   Not on file  Social History Narrative   Single.   Highest level of education Associates.   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   Enjoys Diplomatic Services operational officer, shopping, going out to [employer].    Social Determinants of Health   Financial Resource Strain: Not on file  Food Insecurity: Not on file  Transportation Needs: Not on file  Physical Activity: Not on file  Stress: Not on file  Social Connections: Not on file  Intimate Partner Violence: Not on file    History reviewed. No pertinent surgical history.  Family History  Problem Relation Age of Onset   Lung cancer Maternal Grandmother    Stroke Maternal Grandmother    Asthma Mother     Allergies  Allergen Reactions   Avocado Other (See Comments)   Tomato Other (See Comments)   Tomato Extract Allergy Skin Test     Current Outpatient Medications on File Prior to Visit  Medication Sig Dispense Refill   Crisaborole (EUCRISA) 2 % OINT Eucrisa 2 % topical ointment  APPLY 1 A SMALL AMOUNT TO SKIN ONCE A DAY APPLY TO EARS  escitalopram (LEXAPRO) 10 MG tablet TAKE 1 TABLET (10 MG TOTAL) BY MOUTH DAILY. FOR ANXIETY. 90 tablet 0   JUNEL 1/20 1-20 MG-MCG tablet Take 1 tablet by mouth daily.     No current facility-administered medications on file prior to visit.    BP 102/60   Pulse 90   Temp (!) 97.5 F (36.4 C) (Temporal)   Ht 5' 6.75" (1.695 m)   Wt 141 lb (64 kg)   LMP 12/29/2022 (Exact Date)   SpO2 99%   BMI 22.25 kg/m  Objective:   Physical Exam HENT:     Right Ear: Tympanic membrane and ear canal normal.     Left Ear: Tympanic membrane and ear canal normal.     Nose: Nose normal.  Eyes:     Conjunctiva/sclera: Conjunctivae normal.     Pupils: Pupils are  equal, round, and reactive to light.  Neck:     Thyroid: No thyromegaly.  Cardiovascular:     Rate and Rhythm: Normal rate and regular rhythm.     Heart sounds: No murmur heard. Pulmonary:     Effort: Pulmonary effort is normal.     Breath sounds: Normal breath sounds. No rales.  Abdominal:     General: Bowel sounds are normal.     Palpations: Abdomen is soft.     Tenderness: There is no abdominal tenderness.  Musculoskeletal:        General: Normal range of motion.     Cervical back: Neck supple.  Lymphadenopathy:     Cervical: No cervical adenopathy.  Skin:    General: Skin is warm and dry.     Findings: No rash.  Neurological:     Mental Status: She is alert and oriented to person, place, and time.     Cranial Nerves: No cranial nerve deficit.     Deep Tendon Reflexes: Reflexes are normal and symmetric.  Psychiatric:        Mood and Affect: Mood normal.           Assessment & Plan:  Preventative health care Assessment & Plan: Declines tetanus vaccine.  Pap smear UTD. Follows with GYN.  Discussed the importance of a healthy diet and regular exercise in order for weight loss, and to reduce the risk of further co-morbidity.  Exam stable. Labs pending.  Follow up in 1 year for repeat physical.   Orders: -     Lipid panel -     TSH -     Comprehensive metabolic panel -     CBC  GAD (generalized anxiety disorder) Assessment & Plan: Controlled, but will slowly wean off given patient's preference.  Continue weaning on 5 mg of Lexapro daily and then eventually off.  Consider Wellbutrin in the future.  She will update.          Doreene Nest, NP

## 2023-01-10 DIAGNOSIS — F411 Generalized anxiety disorder: Secondary | ICD-10-CM

## 2023-01-10 MED ORDER — BUPROPION HCL ER (XL) 150 MG PO TB24: 150.0000 mg | ORAL_TABLET | Freq: Every day | ORAL | 0 refills | Status: DC

## 2023-02-21 ENCOUNTER — Ambulatory Visit (INDEPENDENT_AMBULATORY_CARE_PROVIDER_SITE_OTHER): Payer: BC Managed Care – PPO | Admitting: Primary Care

## 2023-02-21 ENCOUNTER — Encounter: Payer: Self-pay | Admitting: Primary Care

## 2023-02-21 VITALS — BP 100/64 | HR 113 | Temp 98.2°F | Ht 66.75 in | Wt 139.0 lb

## 2023-02-21 DIAGNOSIS — N898 Other specified noninflammatory disorders of vagina: Secondary | ICD-10-CM

## 2023-02-21 NOTE — Patient Instructions (Signed)
We will be in touch once we receive your swab results.   It was a pleasure to see you today!

## 2023-02-21 NOTE — Assessment & Plan Note (Addendum)
Unclear etiology at this point. Could be BV or yeast. She declines STD testing.  Wet prep completed and pending today. Await results.

## 2023-02-21 NOTE — Progress Notes (Signed)
Subjective:    Patient ID: Lorraine Wallace, female    DOB: 1996-07-20, 27 y.o.   MRN: [id number]  Vaginal Discharge The patient's primary symptoms include vaginal discharge. The patient's pertinent negatives include no pelvic pain. Pertinent negatives include no abdominal pain, dysuria, fever, frequency or hematuria.    Lorraine Wallace is a very pleasant 27 y.o. female with a history of vaginitis who presents today to discuss vaginal discharge.   Symptom onset a few weeks ago with yellowish vaginal discharge and vaginal itching. She used a few doses of Monistat thinking she had a vaginal yeast infection. Her itching resolved but her discharge remains.   She denies vaginal itching, foul smell, dysuria, urinary frequency, hematuria.    Review of Systems  Constitutional:  Negative for fever.  Gastrointestinal:  Negative for abdominal pain.  Genitourinary:  Positive for vaginal discharge. Negative for dysuria, frequency, hematuria and pelvic pain.         Past Medical History:  Diagnosis Date   Chicken pox    Nausea vomiting and diarrhea 07/13/2019   Urinary frequency 03/04/2019    Social History   Socioeconomic History   Marital status: Married    Spouse name: Not on file   Number of children: Not on file   Years of education: Not on file   Highest education level: Not on file  Occupational History   Not on file  Tobacco Use   Smoking status: Never   Smokeless tobacco: Never  Substance and Sexual Activity   Alcohol use: Yes    Alcohol/week: 0.0 standard drinks of alcohol    Comment: social   Drug use: Not on file   Sexual activity: Not on file  Other Topics Concern   Not on file  Social History Narrative   Single.   Highest level of education Associates.   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   Enjoys Diplomatic Services operational officer, shopping, going out to [employer].    Social Determinants of Health   Financial Resource Strain: Not on file  Food Insecurity: Not on file   Transportation Needs: Not on file  Physical Activity: Not on file  Stress: Not on file  Social Connections: Not on file  Intimate Partner Violence: Not on file    History reviewed. No pertinent surgical history.  Family History  Problem Relation Age of Onset   Lung cancer Maternal Grandmother    Stroke Maternal Grandmother    Asthma Mother     Allergies  Allergen Reactions   Avocado Other (See Comments)   Tomato Other (See Comments)   Tomato Extract Allergy Skin Test     Current Outpatient Medications on File Prior to Visit  Medication Sig Dispense Refill   Crisaborole (EUCRISA) 2 % OINT Eucrisa 2 % topical ointment  APPLY 1 A SMALL AMOUNT TO SKIN ONCE A DAY APPLY TO EARS     escitalopram (LEXAPRO) 10 MG tablet Take 10 mg by mouth daily.     JUNEL 1/20 1-20 MG-MCG tablet Take 1 tablet by mouth daily.     No current facility-administered medications on file prior to visit.    BP 100/64   Pulse (!) 113   Temp 98.2 F (36.8 C) (Temporal)   Ht 5' 6.75" (1.695 m)   Wt 139 lb (63 kg)   LMP 01/22/2023 (Approximate)   SpO2 100%   BMI 21.93 kg/m  Objective:   Physical Exam Exam conducted with a chaperone present.  Constitutional:  General: She is not in acute distress. Genitourinary:    Labia:        Right: No tenderness or lesion.        Left: No tenderness or lesion.      Vagina: Vaginal discharge present.     Cervix: Discharge present. No cervical motion tenderness.     Comments: Moderate amount of whitish/yellowish discharge without foul smell. Skin:    General: Skin is warm and dry.           Assessment & Plan:  Vaginal discharge Assessment & Plan: Unclear etiology at this point. Could be BV or yeast. She declines STD testing.  Wet prep completed and pending today. Await results.   Orders: -     WET PREP BY MOLECULAR PROBE        Doreene Nest, NP

## 2023-02-22 ENCOUNTER — Other Ambulatory Visit: Payer: Self-pay | Admitting: Primary Care

## 2023-02-22 DIAGNOSIS — B9689 Other specified bacterial agents as the cause of diseases classified elsewhere: Secondary | ICD-10-CM

## 2023-02-22 LAB — WET PREP BY MOLECULAR PROBE
Candida species: NOT DETECTED
MICRO NUMBER:: 15079206
SPECIMEN QUALITY:: ADEQUATE
Trichomonas vaginosis: NOT DETECTED

## 2023-02-22 MED ORDER — METRONIDAZOLE 500 MG PO TABS: 500.0000 mg | ORAL_TABLET | Freq: Two times a day (BID) | ORAL | 0 refills | Status: DC

## 2023-02-22 MED ORDER — METRONIDAZOLE 0.75 % VA GEL: 1.0000 | Freq: Two times a day (BID) | VAGINAL | 0 refills | Status: DC

## 2023-03-02 DIAGNOSIS — F411 Generalized anxiety disorder: Secondary | ICD-10-CM

## 2023-03-03 MED ORDER — HYDROXYZINE HCL 10 MG PO TABS: 10.0000 mg | ORAL_TABLET | Freq: Two times a day (BID) | ORAL | 0 refills | Status: DC | PRN

## 2023-03-04 MED ORDER — HYDROXYZINE HCL 10 MG PO TABS: 10.0000 mg | ORAL_TABLET | Freq: Two times a day (BID) | ORAL | 0 refills | Status: AC | PRN

## 2023-03-07 MED ORDER — PROPRANOLOL HCL 10 MG PO TABS: 10.0000 mg | ORAL_TABLET | Freq: Two times a day (BID) | ORAL | 0 refills | Status: AC | PRN

## 2023-03-31 ENCOUNTER — Other Ambulatory Visit: Payer: Self-pay | Admitting: Primary Care

## 2023-03-31 DIAGNOSIS — F411 Generalized anxiety disorder: Secondary | ICD-10-CM

## 2023-04-09 ENCOUNTER — Encounter: Payer: Self-pay | Admitting: Primary Care

## 2023-04-09 ENCOUNTER — Ambulatory Visit (INDEPENDENT_AMBULATORY_CARE_PROVIDER_SITE_OTHER): Payer: BC Managed Care – PPO | Admitting: Primary Care

## 2023-04-09 VITALS — BP 108/62 | HR 85 | Temp 98.1°F | Ht 66.75 in | Wt 134.0 lb

## 2023-04-09 DIAGNOSIS — N898 Other specified noninflammatory disorders of vagina: Secondary | ICD-10-CM

## 2023-04-09 NOTE — Assessment & Plan Note (Signed)
Wet prep collected and pending.  Consider switching treatment courses to clindamycin cream as metronidazole gel was ineffective at this time. Await results.  She declines STD testing.

## 2023-04-09 NOTE — Progress Notes (Signed)
Subjective:    Patient ID: Lorraine Wallace, female    DOB: 1995/12/20, 27 y.o.   MRN: [id number]  Vaginal Discharge The patient's primary symptoms include vaginal discharge. The patient's pertinent negatives include no pelvic pain. Pertinent negatives include no abdominal pain, dysuria, frequency or urgency.    Lorraine Wallace is a very pleasant 27 y.o. female with a history of bacterial vaginosis who presents today to discuss vaginal discharge.  She was last evaluated on 02/21/2023 for vaginal yellowish discharge.  Wet prep collected which was positive for bacterial vaginosis.  She was treated with metronidazole gel as she cannot tolerate oral metronidazole.   Since her last visit her symptoms improved slightly with metronidazole gel, but isn't sure if her symptoms resolved. Because of this she completed another five days of metronidazole on July 15th which did not help with symptoms.   She's also noticed slight vaginal itching. She continues to experience vaginal discharge with green color. She denies vaginal odor, urinary frequency, dysuria. She is not sexually active.  She does not have a history of recurrent BV.   Review of Systems  Gastrointestinal:  Negative for abdominal pain.  Genitourinary:  Positive for vaginal discharge. Negative for dysuria, frequency, pelvic pain and urgency.         Past Medical History:  Diagnosis Date   Chicken pox    Nausea vomiting and diarrhea 07/13/2019   Urinary frequency 03/04/2019    Social History   Socioeconomic History   Marital status: Married    Spouse name: Not on file   Number of children: Not on file   Years of education: Not on file   Highest education level: Not on file  Occupational History   Not on file  Tobacco Use   Smoking status: Never   Smokeless tobacco: Never  Substance and Sexual Activity   Alcohol use: Yes    Alcohol/week: 0.0 standard drinks of alcohol    Comment: social   Drug use: Not on file    Sexual activity: Not on file  Other Topics Concern   Not on file  Social History Narrative   Single.   Highest level of education Associates.   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   Enjoys Diplomatic Services operational officer, shopping, going out to [employer].    Social Determinants of Health   Financial Resource Strain: Not on file  Food Insecurity: Not on file  Transportation Needs: Not on file  Physical Activity: Not on file  Stress: Not on file  Social Connections: Not on file  Intimate Partner Violence: Not on file    History reviewed. No pertinent surgical history.  Family History  Problem Relation Age of Onset   Lung cancer Maternal Grandmother    Stroke Maternal Grandmother    Asthma Mother     Allergies  Allergen Reactions   Avocado Other (See Comments)   Tomato Other (See Comments)   Tomato Extract Allergy Skin Test     Current Outpatient Medications on File Prior to Visit  Medication Sig Dispense Refill   Crisaborole (EUCRISA) 2 % OINT Eucrisa 2 % topical ointment  APPLY 1 A SMALL AMOUNT TO SKIN ONCE A DAY APPLY TO EARS     escitalopram (LEXAPRO) 10 MG tablet TAKE 1 TABLET (10 MG TOTAL) BY MOUTH DAILY. FOR ANXIETY. 90 tablet 0   hydrOXYzine (ATARAX) 10 MG tablet Take 1 tablet (10 mg total) by mouth 2 (two) times daily as needed for anxiety. 60 tablet 0  JUNEL 1/20 1-20 MG-MCG tablet Take 1 tablet by mouth daily.     propranolol (INDERAL) 10 MG tablet Take 1-2 tablets (10-20 mg total) by mouth 2 (two) times daily as needed. For anxiety attacks 30 tablet 0   No current facility-administered medications on file prior to visit.    BP 108/62   Pulse 85   Temp 98.1 F (36.7 C) (Temporal)   Ht 5' 6.75" (1.695 m)   Wt 134 lb (60.8 kg)   LMP 03/25/2023   SpO2 99%   BMI 21.14 kg/m  Objective:   Physical Exam Exam conducted with a chaperone present.  Constitutional:      General: She is not in acute distress. Cardiovascular:     Rate and Rhythm: Normal rate.  Pulmonary:      Effort: Pulmonary effort is normal.  Genitourinary:    Labia:        Right: No tenderness or lesion.        Left: No tenderness or lesion.      Vagina: Vaginal discharge present. No erythema or bleeding.     Cervix: Discharge present. No cervical bleeding.     Comments: Mild to moderate amount of whitish-yellow discharge.  No foul smell. Skin:    General: Skin is warm and dry.           Assessment & Plan:  Vaginal discharge Assessment & Plan: Wet prep collected and pending.  Consider switching treatment courses to clindamycin cream as metronidazole gel was ineffective at this time. Await results.  She declines STD testing.  Orders: -     WET PREP BY MOLECULAR PROBE        Doreene Nest, NP

## 2023-04-09 NOTE — Patient Instructions (Signed)
We will be in touch tomorrow with results.  It was a pleasure to see you today!

## 2023-04-11 NOTE — Telephone Encounter (Signed)
Can we fax her recent wet prep over to [facility] with attention to Dr. Mindi Slicker?  Or does she have to sign a form?

## 2023-04-23 DIAGNOSIS — A499 Bacterial infection, unspecified: Secondary | ICD-10-CM | POA: Diagnosis not present

## 2023-04-23 DIAGNOSIS — N898 Other specified noninflammatory disorders of vagina: Secondary | ICD-10-CM | POA: Diagnosis not present

## 2023-06-27 ENCOUNTER — Other Ambulatory Visit: Payer: Self-pay | Admitting: Primary Care

## 2023-06-27 DIAGNOSIS — F411 Generalized anxiety disorder: Secondary | ICD-10-CM

## 2023-08-12 DIAGNOSIS — Z01419 Encounter for gynecological examination (general) (routine) without abnormal findings: Secondary | ICD-10-CM | POA: Diagnosis not present

## 2023-08-12 DIAGNOSIS — Z130 Encounter for screening for diseases of the blood and blood-forming organs and certain disorders involving the immune mechanism: Secondary | ICD-10-CM | POA: Diagnosis not present

## 2023-10-09 ENCOUNTER — Telehealth: Payer: Self-pay | Admitting: Primary Care

## 2024-01-03 ENCOUNTER — Encounter: Payer: Self-pay | Admitting: Primary Care

## 2024-01-03 ENCOUNTER — Ambulatory Visit (INDEPENDENT_AMBULATORY_CARE_PROVIDER_SITE_OTHER): Admitting: Primary Care

## 2024-01-03 VITALS — BP 120/62 | HR 68 | Temp 98.6°F | Ht 68.0 in | Wt 137.0 lb

## 2024-01-03 DIAGNOSIS — Z114 Encounter for screening for human immunodeficiency virus [HIV]: Secondary | ICD-10-CM | POA: Diagnosis not present

## 2024-01-03 DIAGNOSIS — Z1159 Encounter for screening for other viral diseases: Secondary | ICD-10-CM | POA: Diagnosis not present

## 2024-01-03 DIAGNOSIS — Z0000 Encounter for general adult medical examination without abnormal findings: Secondary | ICD-10-CM | POA: Diagnosis not present

## 2024-01-03 DIAGNOSIS — F411 Generalized anxiety disorder: Secondary | ICD-10-CM

## 2024-01-03 MED ORDER — ESCITALOPRAM OXALATE 10 MG PO TABS: 10.0000 mg | ORAL_TABLET | Freq: Every day | ORAL | 3 refills | Status: AC

## 2024-01-03 NOTE — Progress Notes (Signed)
 Subjective:    Patient ID: Lorraine Wallace, female    DOB: 11/04/1995, 28 y.o.   MRN: [id number]  HPI  Lorraine Wallace is a very pleasant 28 y.o. female who presents today for complete physical and follow up of chronic conditions.    Immunizations: -Tetanus: Completed > 10 years ago   Diet: Fair diet.  Exercise: No regular exercise.  Eye exam: Completed several years ago  Dental exam: Completes semi-annually    Pap Smear: Completed per GYN  BP Readings from Last 3 Encounters:  01/03/24 120/62  04/09/23 108/62  02/21/23 100/64   Wt Readings from Last 3 Encounters:  01/03/24 137 lb (62.1 kg)  04/09/23 134 lb (60.8 kg)  02/21/23 139 lb (63 kg)         Review of Systems  Constitutional:  Negative for unexpected weight change.  HENT:  Negative for rhinorrhea.   Respiratory:  Negative for cough and shortness of breath.   Cardiovascular:  Negative for chest pain.  Gastrointestinal:  Negative for constipation and diarrhea.  Genitourinary:  Negative for difficulty urinating and menstrual problem.  Musculoskeletal:  Negative for arthralgias and myalgias.  Skin:  Negative for rash.       Insect bite to mid upper pelvis, improving.   Allergic/Immunologic: Negative for environmental allergies.  Neurological:  Negative for dizziness, numbness and headaches.  Psychiatric/Behavioral:  The patient is not nervous/anxious.          Past Medical History:  Diagnosis Date   Chest pressure 10/16/2019   Chicken pox    Nausea vomiting and diarrhea 07/13/2019   Urinary frequency 03/04/2019    Social History   Socioeconomic History   Marital status: Married    Spouse name: Not on file   Number of children: Not on file   Years of education: Not on file   Highest education level: Not on file  Occupational History   Not on file  Tobacco Use   Smoking status: Never   Smokeless tobacco: Never  Substance and Sexual Activity   Alcohol use: Yes    Alcohol/week:  0.0 standard drinks of alcohol    Comment: social   Drug use: Not on file   Sexual activity: Not on file  Other Topics Concern   Not on file  Social History Narrative   Single.   Highest level of education Associates.   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    Social Drivers of Corporate investment banker Strain: Not on file  Food Insecurity: Not on file  Transportation Needs: Not on file  Physical Activity: Not on file  Stress: Not on file  Social Connections: Not on file  Intimate Partner Violence: Not on file    History reviewed. No pertinent surgical history.  Family History  Problem Relation Age of Onset   Lung cancer Maternal Grandmother    Stroke Maternal Grandmother    Asthma Mother     Allergies  Allergen Reactions   Avocado Other (See Comments)   Tomato Other (See Comments)   Tomato Extract Allergy Skin Test     Current Outpatient Medications on File Prior to Visit  Medication Sig Dispense Refill   clobetasol cream (TEMOVATE) 0.05 % Apply 1 Application topically 2 (two) times daily.     hydrOXYzine  (ATARAX ) 10 MG tablet Take 1 tablet (10 mg total) by mouth 2 (two) times daily as needed for anxiety. 60 tablet 0  JUNEL 1/20 1-20 MG-MCG tablet Take 1 tablet by mouth daily.     propranolol  (INDERAL ) 10 MG tablet Take 1-2 tablets (10-20 mg total) by mouth 2 (two) times daily as needed. For anxiety attacks 30 tablet 0   Crisaborole (EUCRISA) 2 % OINT Eucrisa 2 % topical ointment  APPLY 1 A SMALL AMOUNT TO SKIN ONCE A DAY APPLY TO EARS (Patient not taking: Reported on 01/03/2024)     No current facility-administered medications on file prior to visit.    BP 120/62   Pulse 68   Temp 98.6 F (37 C) (Temporal)   Ht 5\' 8"  (1.727 m)   Wt 137 lb (62.1 kg)   SpO2 98%   BMI 20.83 kg/m  Objective:   Physical Exam HENT:     Right Ear: Tympanic membrane and ear canal normal.     Left Ear: Tympanic membrane and  ear canal normal.  Eyes:     Pupils: Pupils are equal, round, and reactive to light.  Cardiovascular:     Rate and Rhythm: Normal rate and regular rhythm.  Pulmonary:     Effort: Pulmonary effort is normal.     Breath sounds: Normal breath sounds.  Abdominal:     General: Bowel sounds are normal.     Palpations: Abdomen is soft.     Tenderness: There is no abdominal tenderness.  Musculoskeletal:        General: Normal range of motion.     Cervical back: Neck supple.  Skin:    General: Skin is warm and dry.     Findings: Erythema present.     Comments: 3 cm rounded, flat area of light erythema to mid upper pelvis.   Neurological:     Mental Status: She is alert and oriented to person, place, and time.     Cranial Nerves: No cranial nerve deficit.     Deep Tendon Reflexes:     Reflex Scores:      Patellar reflexes are 2+ on the right side and 2+ on the left side. Psychiatric:        Mood and Affect: Mood normal.           Assessment & Plan:  Encounter for screening for HIV  Generalized anxiety disorder -     Escitalopram  Oxalate; Take 1 tablet (10 mg total) by mouth daily. For anxiety  Dispense: 90 tablet; Refill: 3  Encounter for hepatitis C screening test for low risk patient  GAD (generalized anxiety disorder) Assessment & Plan: Controlled.  Continue Lexapro  10 mg daily. Continue propranolol  10 mg PRN.    Preventative health care Assessment & Plan: Declines tetanus vaccine. Pap smear UTD.  Discussed the importance of a healthy diet and regular exercise in order for weight loss, and to reduce the risk of further co-morbidity.  Exam stable. Labs declined this time.   Follow up in 1 year for repeat physical.          Efton Thomley K Anan Dapolito, NP

## 2024-01-03 NOTE — Patient Instructions (Signed)
 It was a pleasure to see you today!

## 2024-01-03 NOTE — Assessment & Plan Note (Signed)
 Controlled.  Continue Lexapro  10 mg daily. Continue propranolol  10 mg PRN.

## 2024-01-03 NOTE — Assessment & Plan Note (Signed)
 Declines tetanus vaccine. Pap smear UTD.  Discussed the importance of a healthy diet and regular exercise in order for weight loss, and to reduce the risk of further co-morbidity.  Exam stable. Labs declined this time.   Follow up in 1 year for repeat physical.

## 2024-02-06 NOTE — Telephone Encounter (Signed)
 Please set patient up for an office visit.  This can be virtual or in person.  Whichever she prefers.

## 2024-02-11 ENCOUNTER — Telehealth (INDEPENDENT_AMBULATORY_CARE_PROVIDER_SITE_OTHER): Admitting: Primary Care

## 2024-02-11 ENCOUNTER — Encounter: Payer: Self-pay | Admitting: Primary Care

## 2024-02-11 DIAGNOSIS — F419 Anxiety disorder, unspecified: Secondary | ICD-10-CM

## 2024-02-11 DIAGNOSIS — F32A Depression, unspecified: Secondary | ICD-10-CM

## 2024-02-11 DIAGNOSIS — R41840 Attention and concentration deficit: Secondary | ICD-10-CM | POA: Insufficient documentation

## 2024-02-11 MED ORDER — BUPROPION HCL 75 MG PO TABS: 75.0000 mg | ORAL_TABLET | Freq: Two times a day (BID) | ORAL | 0 refills | Status: DC

## 2024-02-11 NOTE — Patient Instructions (Signed)
 Continue Lexapro  10 mg daily for anxiety.  Start bupropion  75 mg once daily for depression/focusing.  You can increase to twice daily after a few weeks if needed.   Please keep me updated!

## 2024-02-11 NOTE — Assessment & Plan Note (Signed)
 We discussed the option for formal ADHD testing, she declines today.  Start bupropion  75 mg once to twice daily for depression and off label for difficulty focusing. She will update.

## 2024-02-11 NOTE — Assessment & Plan Note (Signed)
 Somewhat controlled, does still have anxiety symptoms based on HPI.  Discussed options for treatment, we decided to continue Lexapro  10 mg daily as it has helped historically with physical symptoms of anxiety. Add bupropion  75 mg once daily for now with plans to increase to twice daily if needed.  She will update via MyChart.

## 2024-02-11 NOTE — Progress Notes (Signed)
 Patient ID: Lorraine Wallace, female    DOB: [birth year], 28 y.o.   MRN: [id number]  Virtual visit completed through [employer], a video enabled telemedicine application. Due to national recommendations of social distancing due to COVID-19, a virtual visit is felt to be most appropriate for this patient at this time. Reviewed limitations, risks, security and privacy concerns of performing a virtual visit and the availability of in person appointments. I also reviewed that there may be a patient responsible charge related to this service. The patient agreed to proceed.   Patient location: home Provider location: [place] at [facility], office Persons participating in this virtual visit: patient, provider   If any vitals were documented, they were collected by patient at home unless specified below.    There were no vitals taken for this visit.   CC: Difficulty Concentrating  Subjective:   HPI: Lorraine Wallace is a 28 y.o. female with a history of GAD presenting on 02/11/2024 for ADHD (Discuss non stimulant ADHD medication options)  No formal diagnosis of ADD or ADHD. She follows with therapy who suggested that she may have ADHD. Over the last 6-12 months she's noticed cycles of alternating depression and her typical anxiety with difficulty focusing.  During depression cycles "everything feels grey", little motivation to do things, loses interest in anything. During anxiety cycles she notices difficulty focusing on tasks, short term memory, loses objects often, difficulty with rejection, procrastination, difficulty relaxing, restlessness, feels like she always has to do something.   Currently managed on Lexapro  10 mg which has helped with anxiety overall, especially the physical symptoms of anxiety. She is interested in additional treatment.       Relevant past medical, surgical, family and social history reviewed and updated as indicated. Interim medical history since our last  visit reviewed. Allergies and medications reviewed and updated. Outpatient Medications Prior to Visit  Medication Sig Dispense Refill   clobetasol cream (TEMOVATE) 0.05 % Apply 1 Application topically 2 (two) times daily.     escitalopram  (LEXAPRO ) 10 MG tablet Take 1 tablet (10 mg total) by mouth daily. For anxiety 90 tablet 3   hydrOXYzine  (ATARAX ) 10 MG tablet Take 1 tablet (10 mg total) by mouth 2 (two) times daily as needed for anxiety. 60 tablet 0   JUNEL 1/20 1-20 MG-MCG tablet Take 1 tablet by mouth daily.     propranolol  (INDERAL ) 10 MG tablet Take 1-2 tablets (10-20 mg total) by mouth 2 (two) times daily as needed. For anxiety attacks 30 tablet 0   Crisaborole (EUCRISA) 2 % OINT Eucrisa 2 % topical ointment  APPLY 1 A SMALL AMOUNT TO SKIN ONCE A DAY APPLY TO EARS (Patient not taking: Reported on 02/11/2024)     No facility-administered medications prior to visit.     Per HPI unless specifically indicated in ROS section below Review of Systems  Respiratory:  Negative for shortness of breath.   Cardiovascular:  Negative for chest pain.  Neurological:  Negative for headaches.  Psychiatric/Behavioral:  Positive for decreased concentration and sleep disturbance. The patient is nervous/anxious.        See HPI   Objective:  There were no vitals taken for this visit.  Wt Readings from Last 3 Encounters:  01/03/24 137 lb (62.1 kg)  04/09/23 134 lb (60.8 kg)  02/21/23 139 lb (63 kg)       Physical exam: General: Alert and oriented x 3, no distress, does not appear sickly  Pulmonary: Speaks in complete  sentences without increased work of breathing, no cough during visit.  Psychiatric: Normal mood, thought content, and behavior.     Results for orders placed or performed in visit on 04/09/23  WET PREP BY MOLECULAR PROBE   Collection Time: 04/09/23 11:29 AM   Specimen: Vaginal Swab  Result Value Ref Range   MICRO NUMBER: [id number]    SPECIMEN QUALITY: Adequate    SOURCE:  VAGINA    STATUS: FINAL    Trichomonas vaginosis Not Detected    Gardnerella vaginalis Not Detected    Candida species Not Detected    Assessment & Plan:   Problem List Items Addressed This Visit       Other   Anxiety and depression - Primary   Somewhat controlled, does still have anxiety symptoms based on HPI.  Discussed options for treatment, we decided to continue Lexapro  10 mg daily as it has helped historically with physical symptoms of anxiety. Add bupropion  75 mg once daily for now with plans to increase to twice daily if needed.  She will update via MyChart.      Relevant Medications   buPROPion  (WELLBUTRIN ) 75 MG tablet   Difficulty concentrating   We discussed the option for formal ADHD testing, she declines today.  Start bupropion  75 mg once to twice daily for depression and off label for difficulty focusing. She will update.        Meds ordered this encounter  Medications   buPROPion  (WELLBUTRIN ) 75 MG tablet    Sig: Take 1 tablet (75 mg total) by mouth 2 (two) times daily. For depression and focus    Dispense:  180 tablet    Refill:  0    Supervising Provider:   BEDSOLE, AMY E [[id number]]   No orders of the defined types were placed in this encounter.   I discussed the assessment and treatment plan with the patient. The patient was provided an opportunity to ask questions and all were answered. The patient agreed with the plan and demonstrated an understanding of the instructions. The patient was advised to call back or seek an in-person evaluation if the symptoms worsen or if the condition fails to improve as anticipated.  Follow up plan:  Continue Lexapro  10 mg daily for anxiety.  Start bupropion  75 mg once daily for depression/focusing.  You can increase to twice daily after a few weeks if needed.   Please keep me updated!  Nitesh Pitstick K Kellin Fifer, NP '

## 2024-04-14 ENCOUNTER — Other Ambulatory Visit: Payer: Self-pay | Admitting: Primary Care

## 2024-04-14 DIAGNOSIS — F419 Anxiety disorder, unspecified: Secondary | ICD-10-CM

## 2024-08-11 DIAGNOSIS — L309 Dermatitis, unspecified: Secondary | ICD-10-CM

## 2025-03-22 ENCOUNTER — Ambulatory Visit: Admitting: Physician Assistant
